# Patient Record
Sex: Female | Born: 1985
Health system: Southern US, Community
[De-identification: ages and names within clinical notes are randomized; demographics above are authoritative.]

## PROBLEM LIST (undated history)

## (undated) DIAGNOSIS — R569 Unspecified convulsions: Secondary | ICD-10-CM

## (undated) DIAGNOSIS — G8929 Other chronic pain: Secondary | ICD-10-CM

## (undated) DIAGNOSIS — F319 Bipolar disorder, unspecified: Secondary | ICD-10-CM

## (undated) DIAGNOSIS — I10 Essential (primary) hypertension: Secondary | ICD-10-CM

## (undated) DIAGNOSIS — R079 Chest pain, unspecified: Secondary | ICD-10-CM

## (undated) DIAGNOSIS — F445 Conversion disorder with seizures or convulsions: Secondary | ICD-10-CM

## (undated) DIAGNOSIS — D649 Anemia, unspecified: Secondary | ICD-10-CM

## (undated) HISTORY — DX: Essential (primary) hypertension: I10

## (undated) HISTORY — PX: PELVIC LAPAROSCOPY: SHX162

## (undated) HISTORY — DX: Anemia, unspecified: D64.9

## (undated) HISTORY — DX: Bipolar disorder, unspecified: F31.9

---
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
## 2005-01-14 ENCOUNTER — Ambulatory Visit: Payer: Self-pay | Admitting: Sports Medicine

## 2005-01-28 ENCOUNTER — Emergency Department (HOSPITAL_COMMUNITY): Admission: EM | Admit: 2005-01-28 | Discharge: 2005-01-28 | Payer: Self-pay | Admitting: Family Medicine

## 2005-02-13 ENCOUNTER — Emergency Department (HOSPITAL_COMMUNITY): Admission: EM | Admit: 2005-02-13 | Discharge: 2005-02-13 | Payer: Self-pay | Admitting: Family Medicine

## 2005-07-06 ENCOUNTER — Ambulatory Visit: Payer: Self-pay | Admitting: Family Medicine

## 2005-07-18 ENCOUNTER — Encounter (INDEPENDENT_AMBULATORY_CARE_PROVIDER_SITE_OTHER): Payer: Self-pay | Admitting: *Deleted

## 2005-07-18 LAB — CONVERTED CEMR LAB

## 2005-08-05 ENCOUNTER — Other Ambulatory Visit: Admission: RE | Admit: 2005-08-05 | Discharge: 2005-08-05 | Payer: Self-pay | Admitting: Family Medicine

## 2005-08-05 ENCOUNTER — Ambulatory Visit: Payer: Self-pay | Admitting: Family Medicine

## 2005-10-10 ENCOUNTER — Emergency Department (HOSPITAL_COMMUNITY): Admission: EM | Admit: 2005-10-10 | Discharge: 2005-10-11 | Payer: Self-pay | Admitting: Emergency Medicine

## 2005-12-09 ENCOUNTER — Ambulatory Visit: Payer: Self-pay | Admitting: Family Medicine

## 2005-12-19 ENCOUNTER — Ambulatory Visit: Payer: Self-pay | Admitting: Family Medicine

## 2005-12-29 ENCOUNTER — Inpatient Hospital Stay (HOSPITAL_COMMUNITY): Admission: RE | Admit: 2005-12-29 | Discharge: 2005-12-30 | Payer: Self-pay | Admitting: *Deleted

## 2005-12-29 ENCOUNTER — Ambulatory Visit: Payer: Self-pay | Admitting: Family Medicine

## 2005-12-30 ENCOUNTER — Ambulatory Visit: Payer: Self-pay | Admitting: *Deleted

## 2006-01-09 ENCOUNTER — Ambulatory Visit: Payer: Self-pay | Admitting: Family Medicine

## 2006-01-12 ENCOUNTER — Emergency Department (HOSPITAL_COMMUNITY): Admission: EM | Admit: 2006-01-12 | Discharge: 2006-01-12 | Payer: Self-pay | Admitting: Emergency Medicine

## 2006-01-12 ENCOUNTER — Ambulatory Visit: Payer: Self-pay | Admitting: Family Medicine

## 2006-03-14 ENCOUNTER — Emergency Department (HOSPITAL_COMMUNITY): Admission: EM | Admit: 2006-03-14 | Discharge: 2006-03-14 | Payer: Self-pay | Admitting: Family Medicine

## 2006-05-05 ENCOUNTER — Ambulatory Visit: Payer: Self-pay | Admitting: Family Medicine

## 2006-05-08 ENCOUNTER — Emergency Department (HOSPITAL_COMMUNITY): Admission: EM | Admit: 2006-05-08 | Discharge: 2006-05-08 | Payer: Self-pay | Admitting: Family Medicine

## 2006-06-07 ENCOUNTER — Ambulatory Visit: Payer: Self-pay | Admitting: Family Medicine

## 2006-08-06 ENCOUNTER — Emergency Department (HOSPITAL_COMMUNITY): Admission: EM | Admit: 2006-08-06 | Discharge: 2006-08-06 | Payer: Self-pay | Admitting: Family Medicine

## 2006-09-14 DIAGNOSIS — E669 Obesity, unspecified: Secondary | ICD-10-CM | POA: Insufficient documentation

## 2006-09-14 DIAGNOSIS — G4700 Insomnia, unspecified: Secondary | ICD-10-CM | POA: Insufficient documentation

## 2006-09-15 ENCOUNTER — Encounter (INDEPENDENT_AMBULATORY_CARE_PROVIDER_SITE_OTHER): Payer: Self-pay | Admitting: *Deleted

## 2006-10-23 ENCOUNTER — Telehealth: Payer: Self-pay | Admitting: *Deleted

## 2006-12-08 ENCOUNTER — Encounter (INDEPENDENT_AMBULATORY_CARE_PROVIDER_SITE_OTHER): Payer: Self-pay | Admitting: Family Medicine

## 2006-12-08 ENCOUNTER — Ambulatory Visit: Payer: Self-pay | Admitting: Family Medicine

## 2006-12-08 LAB — CONVERTED CEMR LAB
Chlamydia, DNA Probe: NEGATIVE
GC Probe Amp, Genital: NEGATIVE
Glucose, Urine, Semiquant: NEGATIVE
Nitrite: NEGATIVE
Specific Gravity, Urine: 1.02
Whiff Test: POSITIVE

## 2007-01-08 ENCOUNTER — Encounter (INDEPENDENT_AMBULATORY_CARE_PROVIDER_SITE_OTHER): Payer: Self-pay | Admitting: Family Medicine

## 2007-03-01 ENCOUNTER — Ambulatory Visit: Payer: Self-pay | Admitting: Gynecology

## 2007-03-19 ENCOUNTER — Emergency Department (HOSPITAL_COMMUNITY): Admission: EM | Admit: 2007-03-19 | Discharge: 2007-03-19 | Payer: Self-pay | Admitting: Emergency Medicine

## 2007-04-17 ENCOUNTER — Ambulatory Visit (HOSPITAL_COMMUNITY): Admission: RE | Admit: 2007-04-17 | Discharge: 2007-04-17 | Payer: Self-pay | Admitting: Gynecology

## 2007-04-17 ENCOUNTER — Ambulatory Visit: Payer: Self-pay | Admitting: Gynecology

## 2007-05-10 ENCOUNTER — Ambulatory Visit: Payer: Self-pay | Admitting: Gynecology

## 2007-07-03 ENCOUNTER — Telehealth (INDEPENDENT_AMBULATORY_CARE_PROVIDER_SITE_OTHER): Payer: Self-pay | Admitting: *Deleted

## 2007-08-19 ENCOUNTER — Emergency Department (HOSPITAL_COMMUNITY): Admission: EM | Admit: 2007-08-19 | Discharge: 2007-08-19 | Payer: Self-pay | Admitting: Emergency Medicine

## 2007-09-03 ENCOUNTER — Ambulatory Visit: Payer: Self-pay | Admitting: Family Medicine

## 2007-09-03 ENCOUNTER — Encounter: Payer: Self-pay | Admitting: *Deleted

## 2007-09-03 LAB — CONVERTED CEMR LAB
Blood in Urine, dipstick: NEGATIVE
Glucose, Urine, Semiquant: NEGATIVE
Protein, U semiquant: 30
Urobilinogen, UA: NEGATIVE

## 2007-09-04 ENCOUNTER — Encounter (INDEPENDENT_AMBULATORY_CARE_PROVIDER_SITE_OTHER): Payer: Self-pay | Admitting: *Deleted

## 2007-09-08 ENCOUNTER — Emergency Department (HOSPITAL_COMMUNITY): Admission: EM | Admit: 2007-09-08 | Discharge: 2007-09-08 | Payer: Self-pay | Admitting: Emergency Medicine

## 2007-09-10 ENCOUNTER — Encounter (INDEPENDENT_AMBULATORY_CARE_PROVIDER_SITE_OTHER): Payer: Self-pay | Admitting: *Deleted

## 2007-09-28 ENCOUNTER — Telehealth: Payer: Self-pay | Admitting: *Deleted

## 2007-10-01 ENCOUNTER — Encounter (INDEPENDENT_AMBULATORY_CARE_PROVIDER_SITE_OTHER): Payer: Self-pay | Admitting: Family Medicine

## 2007-10-01 ENCOUNTER — Ambulatory Visit: Payer: Self-pay | Admitting: Family Medicine

## 2007-10-01 LAB — CONVERTED CEMR LAB
Albumin: 4.1 g/dL (ref 3.5–5.2)
Beta hcg, urine, semiquantitative: NEGATIVE
CO2: 22 meq/L (ref 19–32)
Calcium: 9.4 mg/dL (ref 8.4–10.5)
Creatinine, Ser: 0.64 mg/dL (ref 0.40–1.20)
Glucose, Urine, Semiquant: NEGATIVE
HCT: 36.1 % (ref 36.0–46.0)
Hemoglobin: 11.7 g/dL — ABNORMAL LOW (ref 12.0–15.0)
MCV: 86.8 fL (ref 78.0–100.0)
Monocytes Absolute: 0.4 10*3/uL (ref 0.1–1.0)
Neutro Abs: 2.5 10*3/uL (ref 1.7–7.7)
Nitrite: NEGATIVE
Pap Smear: NORMAL
Platelets: 255 10*3/uL (ref 150–400)
RDW: 13.7 % (ref 11.5–15.5)
Sodium: 144 meq/L (ref 135–145)
Total Protein: 7.4 g/dL (ref 6.0–8.3)
WBC: 4.9 10*3/uL (ref 4.0–10.5)
Whiff Test: NEGATIVE

## 2007-10-02 ENCOUNTER — Encounter (INDEPENDENT_AMBULATORY_CARE_PROVIDER_SITE_OTHER): Payer: Self-pay | Admitting: Family Medicine

## 2007-10-16 ENCOUNTER — Ambulatory Visit: Payer: Self-pay | Admitting: Family Medicine

## 2007-10-16 ENCOUNTER — Telehealth: Payer: Self-pay | Admitting: *Deleted

## 2007-10-16 LAB — CONVERTED CEMR LAB
Glucose, Urine, Semiquant: NEGATIVE
Protein, U semiquant: NEGATIVE

## 2007-10-19 ENCOUNTER — Ambulatory Visit: Payer: Self-pay | Admitting: Family Medicine

## 2007-10-19 ENCOUNTER — Encounter (INDEPENDENT_AMBULATORY_CARE_PROVIDER_SITE_OTHER): Payer: Self-pay | Admitting: Family Medicine

## 2007-10-19 DIAGNOSIS — N809 Endometriosis, unspecified: Secondary | ICD-10-CM | POA: Insufficient documentation

## 2007-11-07 ENCOUNTER — Telehealth: Payer: Self-pay | Admitting: *Deleted

## 2008-01-21 ENCOUNTER — Telehealth: Payer: Self-pay | Admitting: *Deleted

## 2008-01-22 ENCOUNTER — Encounter (INDEPENDENT_AMBULATORY_CARE_PROVIDER_SITE_OTHER): Payer: Self-pay | Admitting: Family Medicine

## 2008-01-22 ENCOUNTER — Ambulatory Visit: Payer: Self-pay | Admitting: Family Medicine

## 2008-01-22 LAB — CONVERTED CEMR LAB
Beta hcg, urine, semiquantitative: NEGATIVE
Whiff Test: NEGATIVE

## 2008-01-23 LAB — CONVERTED CEMR LAB
Chlamydia, DNA Probe: NEGATIVE
GC Probe Amp, Genital: NEGATIVE

## 2008-02-27 ENCOUNTER — Emergency Department (HOSPITAL_COMMUNITY): Admission: EM | Admit: 2008-02-27 | Discharge: 2008-02-27 | Payer: Self-pay | Admitting: Emergency Medicine

## 2008-03-08 ENCOUNTER — Emergency Department (HOSPITAL_COMMUNITY): Admission: EM | Admit: 2008-03-08 | Discharge: 2008-03-09 | Payer: Self-pay | Admitting: Emergency Medicine

## 2008-06-20 ENCOUNTER — Telehealth: Payer: Self-pay | Admitting: *Deleted

## 2008-06-27 ENCOUNTER — Ambulatory Visit: Payer: Self-pay | Admitting: Family Medicine

## 2008-06-27 ENCOUNTER — Encounter: Payer: Self-pay | Admitting: Family Medicine

## 2008-06-27 LAB — CONVERTED CEMR LAB
Basophils Absolute: 0 10*3/uL (ref 0.0–0.1)
Bilirubin Urine: NEGATIVE
Glucose, Urine, Semiquant: NEGATIVE
Ketones, urine, test strip: NEGATIVE
Monocytes Absolute: 0.5 10*3/uL (ref 0.1–1.0)
Monocytes Relative: 12 % (ref 3–12)
Protein, U semiquant: NEGATIVE
RBC: 4.22 M/uL (ref 3.87–5.11)
Specific Gravity, Urine: 1.02
TSH: 1.298 microintl units/mL (ref 0.350–4.50)
pH: 7

## 2008-06-30 ENCOUNTER — Encounter: Payer: Self-pay | Admitting: Family Medicine

## 2008-07-18 ENCOUNTER — Emergency Department (HOSPITAL_COMMUNITY): Admission: EM | Admit: 2008-07-18 | Discharge: 2008-07-19 | Payer: Self-pay | Admitting: Emergency Medicine

## 2008-07-20 ENCOUNTER — Telehealth: Payer: Self-pay | Admitting: Family Medicine

## 2008-07-21 ENCOUNTER — Encounter: Payer: Self-pay | Admitting: Family Medicine

## 2008-07-21 ENCOUNTER — Ambulatory Visit: Payer: Self-pay | Admitting: Family Medicine

## 2008-07-21 LAB — CONVERTED CEMR LAB
AST: 16 units/L (ref 0–37)
Albumin: 4.1 g/dL (ref 3.5–5.2)
Basophils Absolute: 0 10*3/uL (ref 0.0–0.1)
Basophils Relative: 0 % (ref 0–1)
Benzodiazepines.: NEGATIVE
CO2: 23 meq/L (ref 19–32)
Calcium: 9.4 mg/dL (ref 8.4–10.5)
Eosinophils Absolute: 0.1 10*3/uL (ref 0.0–0.7)
Eosinophils Relative: 1 % (ref 0–5)
Glucose, Bld: 90 mg/dL (ref 70–99)
Hemoglobin: 12.1 g/dL (ref 12.0–15.0)
Marijuana Metabolite: NEGATIVE
Methadone: NEGATIVE
Monocytes Absolute: 0.5 10*3/uL (ref 0.1–1.0)
Monocytes Relative: 10 % (ref 3–12)
Neutrophils Relative %: 52 % (ref 43–77)
RBC: 4.4 M/uL (ref 3.87–5.11)
Sodium: 141 meq/L (ref 135–145)
WBC: 4.5 10*3/uL (ref 4.0–10.5)

## 2008-07-22 ENCOUNTER — Encounter: Admission: RE | Admit: 2008-07-22 | Discharge: 2008-07-22 | Payer: Self-pay | Admitting: Family Medicine

## 2008-07-23 ENCOUNTER — Telehealth (INDEPENDENT_AMBULATORY_CARE_PROVIDER_SITE_OTHER): Payer: Self-pay | Admitting: *Deleted

## 2008-07-23 ENCOUNTER — Telehealth: Payer: Self-pay | Admitting: *Deleted

## 2008-07-24 ENCOUNTER — Telehealth (INDEPENDENT_AMBULATORY_CARE_PROVIDER_SITE_OTHER): Payer: Self-pay | Admitting: Family Medicine

## 2008-07-24 ENCOUNTER — Encounter (INDEPENDENT_AMBULATORY_CARE_PROVIDER_SITE_OTHER): Payer: Self-pay | Admitting: Family Medicine

## 2008-07-30 ENCOUNTER — Encounter (INDEPENDENT_AMBULATORY_CARE_PROVIDER_SITE_OTHER): Payer: Self-pay | Admitting: Family Medicine

## 2008-07-30 ENCOUNTER — Ambulatory Visit: Payer: Self-pay | Admitting: Family Medicine

## 2008-08-03 ENCOUNTER — Telehealth (INDEPENDENT_AMBULATORY_CARE_PROVIDER_SITE_OTHER): Payer: Self-pay | Admitting: Family Medicine

## 2008-08-08 ENCOUNTER — Encounter (INDEPENDENT_AMBULATORY_CARE_PROVIDER_SITE_OTHER): Payer: Self-pay | Admitting: *Deleted

## 2008-08-14 ENCOUNTER — Ambulatory Visit (HOSPITAL_COMMUNITY): Admission: RE | Admit: 2008-08-14 | Discharge: 2008-08-14 | Payer: Self-pay | Admitting: Family Medicine

## 2008-08-20 ENCOUNTER — Encounter (INDEPENDENT_AMBULATORY_CARE_PROVIDER_SITE_OTHER): Payer: Self-pay | Admitting: Family Medicine

## 2008-08-21 ENCOUNTER — Encounter (INDEPENDENT_AMBULATORY_CARE_PROVIDER_SITE_OTHER): Payer: Self-pay | Admitting: Family Medicine

## 2008-08-22 ENCOUNTER — Encounter (INDEPENDENT_AMBULATORY_CARE_PROVIDER_SITE_OTHER): Payer: Self-pay | Admitting: Family Medicine

## 2008-08-29 ENCOUNTER — Telehealth (INDEPENDENT_AMBULATORY_CARE_PROVIDER_SITE_OTHER): Payer: Self-pay | Admitting: *Deleted

## 2008-09-19 ENCOUNTER — Emergency Department (HOSPITAL_COMMUNITY): Admission: EM | Admit: 2008-09-19 | Discharge: 2008-09-20 | Payer: Self-pay | Admitting: Emergency Medicine

## 2008-09-20 ENCOUNTER — Telehealth: Payer: Self-pay | Admitting: *Deleted

## 2008-09-23 ENCOUNTER — Ambulatory Visit: Payer: Self-pay | Admitting: Family Medicine

## 2008-09-23 ENCOUNTER — Encounter (INDEPENDENT_AMBULATORY_CARE_PROVIDER_SITE_OTHER): Payer: Self-pay | Admitting: Family Medicine

## 2008-09-23 DIAGNOSIS — F3189 Other bipolar disorder: Secondary | ICD-10-CM | POA: Insufficient documentation

## 2008-09-29 ENCOUNTER — Telehealth (INDEPENDENT_AMBULATORY_CARE_PROVIDER_SITE_OTHER): Payer: Self-pay | Admitting: Family Medicine

## 2008-09-29 ENCOUNTER — Encounter (INDEPENDENT_AMBULATORY_CARE_PROVIDER_SITE_OTHER): Payer: Self-pay | Admitting: Family Medicine

## 2008-09-30 ENCOUNTER — Telehealth (INDEPENDENT_AMBULATORY_CARE_PROVIDER_SITE_OTHER): Payer: Self-pay | Admitting: Family Medicine

## 2008-10-01 ENCOUNTER — Encounter (INDEPENDENT_AMBULATORY_CARE_PROVIDER_SITE_OTHER): Payer: Self-pay | Admitting: Family Medicine

## 2008-10-23 ENCOUNTER — Encounter: Payer: Self-pay | Admitting: *Deleted

## 2008-10-24 ENCOUNTER — Encounter (INDEPENDENT_AMBULATORY_CARE_PROVIDER_SITE_OTHER): Payer: Self-pay | Admitting: Family Medicine

## 2008-11-17 ENCOUNTER — Encounter (INDEPENDENT_AMBULATORY_CARE_PROVIDER_SITE_OTHER): Payer: Self-pay | Admitting: Family Medicine

## 2008-11-17 ENCOUNTER — Ambulatory Visit: Payer: Self-pay | Admitting: Family Medicine

## 2008-11-17 DIAGNOSIS — R1013 Epigastric pain: Secondary | ICD-10-CM

## 2008-11-17 DIAGNOSIS — K3189 Other diseases of stomach and duodenum: Secondary | ICD-10-CM | POA: Insufficient documentation

## 2008-12-10 ENCOUNTER — Ambulatory Visit: Payer: Self-pay | Admitting: Family Medicine

## 2008-12-10 ENCOUNTER — Encounter (INDEPENDENT_AMBULATORY_CARE_PROVIDER_SITE_OTHER): Payer: Self-pay | Admitting: Family Medicine

## 2008-12-21 ENCOUNTER — Telehealth: Payer: Self-pay | Admitting: Family Medicine

## 2009-01-26 ENCOUNTER — Encounter: Payer: Self-pay | Admitting: Family Medicine

## 2009-01-27 ENCOUNTER — Encounter: Payer: Self-pay | Admitting: Family Medicine

## 2009-02-26 ENCOUNTER — Encounter: Payer: Self-pay | Admitting: Family Medicine

## 2009-04-19 ENCOUNTER — Emergency Department (HOSPITAL_COMMUNITY): Admission: EM | Admit: 2009-04-19 | Discharge: 2009-04-19 | Payer: Self-pay | Admitting: Emergency Medicine

## 2009-04-19 ENCOUNTER — Emergency Department (HOSPITAL_COMMUNITY): Admission: EM | Admit: 2009-04-19 | Discharge: 2009-04-19 | Payer: Self-pay | Admitting: Family Medicine

## 2009-04-28 ENCOUNTER — Telehealth: Payer: Self-pay | Admitting: Sports Medicine

## 2009-06-21 ENCOUNTER — Telehealth: Payer: Self-pay | Admitting: Family Medicine

## 2009-06-26 ENCOUNTER — Encounter: Payer: Self-pay | Admitting: Family Medicine

## 2009-06-26 ENCOUNTER — Ambulatory Visit: Payer: Self-pay | Admitting: Family Medicine

## 2009-06-26 DIAGNOSIS — R1084 Generalized abdominal pain: Secondary | ICD-10-CM | POA: Insufficient documentation

## 2009-06-26 DIAGNOSIS — R079 Chest pain, unspecified: Secondary | ICD-10-CM | POA: Insufficient documentation

## 2009-06-26 LAB — CONVERTED CEMR LAB
Bilirubin Urine: NEGATIVE
GC Probe Amp, Genital: NEGATIVE
Nitrite: NEGATIVE
Specific Gravity, Urine: 1.025
Urobilinogen, UA: 0.2
Whiff Test: NEGATIVE

## 2009-06-27 ENCOUNTER — Encounter: Payer: Self-pay | Admitting: Family Medicine

## 2009-11-30 ENCOUNTER — Emergency Department (HOSPITAL_COMMUNITY): Admission: EM | Admit: 2009-11-30 | Discharge: 2009-11-30 | Payer: Self-pay | Admitting: Family Medicine

## 2009-12-01 ENCOUNTER — Emergency Department (HOSPITAL_COMMUNITY): Admission: EM | Admit: 2009-12-01 | Discharge: 2009-12-01 | Payer: Self-pay | Admitting: Emergency Medicine

## 2009-12-18 ENCOUNTER — Ambulatory Visit: Payer: Self-pay | Admitting: Family Medicine

## 2009-12-18 ENCOUNTER — Encounter: Payer: Self-pay | Admitting: Family Medicine

## 2009-12-18 DIAGNOSIS — N912 Amenorrhea, unspecified: Secondary | ICD-10-CM | POA: Insufficient documentation

## 2009-12-18 DIAGNOSIS — R300 Dysuria: Secondary | ICD-10-CM | POA: Insufficient documentation

## 2009-12-18 LAB — CONVERTED CEMR LAB
Beta hcg, urine, semiquantitative: NEGATIVE
Ketones, urine, test strip: NEGATIVE
Protein, U semiquant: 30
Specific Gravity, Urine: 1.025

## 2009-12-22 ENCOUNTER — Telehealth: Payer: Self-pay | Admitting: Family Medicine

## 2010-01-20 ENCOUNTER — Telehealth: Payer: Self-pay | Admitting: Family Medicine

## 2010-01-21 ENCOUNTER — Encounter: Payer: Self-pay | Admitting: Sports Medicine

## 2010-01-21 ENCOUNTER — Ambulatory Visit: Payer: Self-pay | Admitting: Family Medicine

## 2010-01-21 LAB — CONVERTED CEMR LAB: Beta hcg, urine, semiquantitative: NEGATIVE

## 2010-03-04 ENCOUNTER — Encounter: Payer: Self-pay | Admitting: Family Medicine

## 2010-03-04 ENCOUNTER — Ambulatory Visit: Payer: Self-pay | Admitting: Family Medicine

## 2010-03-05 ENCOUNTER — Telehealth: Payer: Self-pay | Admitting: Family Medicine

## 2010-03-05 LAB — CONVERTED CEMR LAB: hCG, Beta Chain, Quant, S: <2 milliintl units/mL

## 2010-03-18 ENCOUNTER — Ambulatory Visit: Payer: Self-pay | Admitting: Family Medicine

## 2010-03-18 LAB — CONVERTED CEMR LAB: Whiff Test: NEGATIVE

## 2010-03-29 ENCOUNTER — Telehealth: Payer: Self-pay | Admitting: Family Medicine

## 2010-04-28 ENCOUNTER — Other Ambulatory Visit: Admission: RE | Admit: 2010-04-28 | Discharge: 2010-04-28 | Payer: Self-pay | Admitting: Gynecology

## 2010-04-28 ENCOUNTER — Ambulatory Visit: Payer: Self-pay | Admitting: Gynecology

## 2010-05-27 ENCOUNTER — Ambulatory Visit: Payer: Self-pay | Admitting: Gynecology

## 2010-06-14 ENCOUNTER — Ambulatory Visit: Payer: Self-pay | Admitting: Gynecology

## 2010-07-18 ENCOUNTER — Emergency Department (HOSPITAL_COMMUNITY)
Admission: EM | Admit: 2010-07-18 | Discharge: 2010-07-18 | Payer: Self-pay | Source: Home / Self Care | Admitting: Family Medicine

## 2010-07-22 ENCOUNTER — Encounter: Payer: Self-pay | Admitting: Family Medicine

## 2010-07-23 ENCOUNTER — Encounter: Payer: Self-pay | Admitting: Family Medicine

## 2010-07-26 ENCOUNTER — Ambulatory Visit: Admit: 2010-07-26 | Payer: Self-pay | Admitting: Women's Health

## 2010-08-17 ENCOUNTER — Ambulatory Visit
Admission: RE | Admit: 2010-08-17 | Discharge: 2010-08-17 | Payer: Self-pay | Source: Home / Self Care | Attending: Gynecology | Admitting: Gynecology

## 2010-08-19 NOTE — Progress Notes (Signed)
Summary: triage   Phone Note Call from Patient Call back at Home Phone [phone] Call back at or [phone]   Caller: Patient Summary of Call: Having side effects from birth control and would like to ask questions. Initial call taken by: Clydell Hakim,  January 20, 2010 8:53 AM  Follow-up for Phone Call        LM Follow-up by: Golden Circle RN,  January 20, 2010 8:56 AM  Additional Follow-up for Phone Call Additional follow up Details #1::        c/o cramping & breasts tender. has been on OCP x 2 weeks. home preg test negative. wants to be seen & discuss concerns over depo wearing off & ocps working. states she was told she would have a period by now & has not. wants to dicuss with md. works & cannot get here before 4pm. placed with Dr. Karie Schwalbe at 4 tomorrow Additional Follow-up by: Golden Circle RN,  January 20, 2010 8:57 AM

## 2010-08-19 NOTE — Letter (Signed)
Summary: [facility] D/C Summary  [facility] D/C Summary   Imported By: De Nurse 08/02/2010 13:56:02  _____________________________________________________________________  External Attachment:    Type:   Image     Comment:   External Document

## 2010-08-19 NOTE — Assessment & Plan Note (Signed)
Summary: questions about OCP/[place]   Vital Signs:  Patient profile:   25 year old female Weight:      223 pounds Temp:     98.5 degrees F oral Pulse rate:   89 / minute Pulse rhythm:   regular BP sitting:   138 / 80  (left arm) Cuff size:   large  Vitals Entered By: Loralee Pacas CMA (January 21, 2010 4:42 PM)  Primary Care Provider:  Alvia Grove DO   History of Present Illness: Pt comes in with concerns about missing period for almost 2 months now.  Breast tenderness, mood changes.    Hx:  Last Depo shot  ~October 07, 2009.  Was due for another end of June but switched to OCPs.  Didn't have her period and hasn't actually had any period since January while on Depo.  On Tri Sprintec. Has phenergan and naproxen.  Concerned she may be pregnant.   Current Medications (verified): 1)  Depakote Er 500 Mg Xr24h-Tab (Divalproex Sodium) .Marland Kitchen.. 1 Tablet By Mouth Three Times A Day 2)  Celexa .... Dose Unknown. 3)  Ranitidine Hcl 150 Mg Tabs (Ranitidine Hcl) .Marland Kitchen.. 1 Tab By Mouth Two Times A Day For Gerd 4)  Macrobid 100 Mg Caps (Nitrofurantoin Monohyd Macro) .... Take One Two Times A Day For 5 Days 5)  Ortho Tri-Cyclen (28) 0.18/0.215/0.25 Mg-35 Mcg Tabs (Norgestim-Eth Estrad Triphasic) .... Take One Pill Daily  Allergies (verified): 1)  ! Lamictal (Lamotrigine) 2)  ! * Contrast Dye  Review of Systems       See HPI  Physical Exam  General:  Well-developed,well-nourished,in no acute distress; alert,appropriate and cooperative throughout examination Abdomen:  Bowel sounds positive,abdomen soft and non-tender without masses, organomegaly or hernias noted.   Impression & Recommendations:  Problem # 1:  CONTRACEPTIVE MANAGEMENT (ICD-V25.09) Assessment Unchanged Upreg neg, explained how cycles work and how irregular menstruation coming off of depo was normal as the medicine leaves her system gradually.  Advised to stay on sprintec unless she wants to get pregnant.  Orders: [facility]- Est  Level  3 (16109)  Problem # 2:  AMENORRHEA (ICD-626.0) Assessment: Unchanged See #1.  Her updated medication list for this problem includes:    Ortho Tri-cyclen (28) 0.18/0.215/0.25 Mg-35 Mcg Tabs (Norgestim-eth estrad triphasic) .Marland Kitchen... Take one pill daily  Orders: U Preg-[facility] (81025) [facility]- Est Level  3 (60454)  Complete Medication List: 1)  Depakote Er 500 Mg Xr24h-tab (Divalproex sodium) .Marland Kitchen.. 1 tablet by mouth three times a day 2)  Celexa  .... Dose unknown. 3)  Ranitidine Hcl 150 Mg Tabs (Ranitidine hcl) .Marland Kitchen.. 1 tab by mouth two times a day for gerd 4)  Macrobid 100 Mg Caps (Nitrofurantoin monohyd macro) .... Take one two times a day for 5 days 5)  Ortho Tri-cyclen (28) 0.18/0.215/0.25 Mg-35 Mcg Tabs (Norgestim-eth estrad triphasic) .... Take one pill daily  Patient Instructions: 1)  Great to meet you 2)  Pregnancy test was negative. 3)  You will have irregular periods coming off the depo that could last a few months.  Continue the birth control pills unless you want to get pregnant. 4)  Use naproxen for pain and phenergan if needed for nausea.  Keep WELL HYDRATED. 5)  -Dr. Karie Schwalbe.  Laboratory Results   Urine Tests  Date/Time Received: January 21, 2010 4:45 PM  Date/Time Reported: January 21, 2010 4:52 PM     Urine HCG: negative Comments: ...............test performed by......Marland KitchenBonnie A. [place], MLS (ASCP)cm

## 2010-08-19 NOTE — Miscellaneous (Signed)
Summary: Orders Update   Clinical Lists Changes  Orders: Added new Test order of B-HCG Quant-[facility] [phone]) - Signed      Pt presented with her daughter who was being seen, has not had a cycle this month, upreg at home neg, period 1 week late. Beta HCG drawn. Pt is to transfer care so that family has the same PCP. Milinda Antis MD  March 04, 2010 10:53 AM

## 2010-08-19 NOTE — Consult Note (Signed)
Summary: [facility] - ED notes  [facility] - ED notes   Imported By: De Nurse 08/02/2010 13:55:30  _____________________________________________________________________  External Attachment:    Type:   Image     Comment:   External Document

## 2010-08-19 NOTE — Progress Notes (Signed)
Summary: BHCG-Negative   Phone Note Outgoing Call   Call placed by: Milinda Antis MD,  March 05, 2010 11:08 AM Details for Reason: UPREG  Summary of Call: Kaitlyn [name] is negative. Change in ovulation may be secondary to stopping OCP abruptly. Pt given option to hold until period starts or go ahead and start now.

## 2010-08-19 NOTE — Progress Notes (Signed)
Summary: triage   Phone Note Call from Patient Call back at Home Phone [phone]   Caller: Patient Summary of Call: Needs to ask question about an issue she is having with her birth control. Initial call taken by: Clydell Hakim,  March 29, 2010 3:15 PM  Follow-up for Phone Call        forget to take her ocp fri & sat & monday. had doubled up sunday. now on inactive pills. told her to take the inactive pills & restart a new pack Sunday . warned her that she is NOT protected against pregnancy & should either abstain or use 2 other methods of contraception-spermacides & condoms. after she has been on active pills 7 days, should have protection again. urged her to take at same time every day or they may not be as effective. told her we have other methods if she wants to discuss a change Follow-up by: Golden Circle RN,  March 29, 2010 3:28 PM    agree

## 2010-08-19 NOTE — Progress Notes (Signed)
 Summary: Emergency Line Call   Phone Note Call from Patient   Caller: Patient Call For: Emergency Line Summary of Call: 65F calls in with complaints of back pain for 2 weeks.  Ambulatory, able to carry out ADLs, no fevers/chills, no dysuria, had UTI but finished course of antibiotics, pain does not radiate, no loss of bladder/stool continence.  Informed her I will not call in medications and that she needs to call the [facility] in the morning to make appt for SDA.  She agrees to do so. Initial call taken by: Debby Petties MD,  April 28, 2009 7:12 PM

## 2010-08-19 NOTE — Assessment & Plan Note (Signed)
Summary: BV?,df   Vital Signs:  Patient profile:   25 year old female Height:      60 inches Weight:      222 pounds BMI:     43.51 Temp:     98.8 degrees F oral Pulse rate:   102 / minute BP sitting:   137 / 83  (left arm) Cuff size:   large  Vitals Entered By: Tessie Fass CMA (March 18, 2010 4:20 PM)  Primary Care Provider:  Alvia Grove DO  CC:  VAGINAL ITCH.  History of Present Illness: 25 yo F:  1. Vaginal Itch: x several days, no real discharge or odor, + sexually active, used an OTC cream yesterday for yeast that has not helped. LMP > 6 weeks ago - had neg serum preg test 8/18, taking OCPs. + GC/Chl, and Trich in past.  Habits & Providers  Alcohol-Tobacco-Diet     Tobacco Status: never  Current Medications (verified): 1)  Depakote Er 500 Mg Xr24h-Tab (Divalproex Sodium) .Marland Kitchen.. 1 Tablet By Mouth Three Times A Day 2)  Celexa .... Dose Unknown. 3)  Ranitidine Hcl 150 Mg Tabs (Ranitidine Hcl) .Marland Kitchen.. 1 Tab By Mouth Two Times A Day For Gerd 4)  Ortho Tri-Cyclen (28) 0.18/0.215/0.25 Mg-35 Mcg Tabs (Norgestim-Eth Estrad Triphasic) .... Take One Pill Daily 5)  Diflucan 150 Mg Tabs (Fluconazole) .... Once Daily, Then Again in 3 Days  Allergies (verified): 1)  ! Lamictal (Lamotrigine) 2)  ! * Contrast Dye PMH-FH-SH reviewed for relevance  Review of Systems General:  Denies chills and fever. GI:  Denies abdominal pain. GU:  Complains of discharge; denies abnormal vaginal bleeding, dysuria, genital sores, hematuria, urinary frequency, and urinary hesitancy. Derm:  Complains of itching.  Physical Exam  General:  Well-developed, well-nourished, in no acute distress; alert, appropriate and cooperative throughout examination. Vitals reviewed. Genitalia:  Normal introitus, no external lesions, mucosa pink and moist, and no vaginal or cervical lesions.  Small amount of thick, white, cottage-cheese discharge - samples taken. Psych:  Oriented X3, memory intact for recent and  remote, normally interactive, good eye contact, not anxious appearing, and not depressed appearing.     Impression & Recommendations:  Problem # 1:  VAGINAL DISCHARGE (ICD-623.5) Assessment New  Orders: Wet Prep- [facility] (54098) [facility]- Est Level  3 (11914)  Problem # 2:  VAGINITIS, CANDIDAL (ICD-112.1) Assessment: New  Her updated medication list for this problem includes:    Diflucan 150 Mg Tabs (Fluconazole) ..... Once daily, then again in 3 days  Orders: [facility]- Est Level  3 (78295)  Complete Medication List: 1)  Depakote Er 500 Mg Xr24h-tab (Divalproex sodium) .Marland Kitchen.. 1 tablet by mouth three times a day 2)  Celexa  .... Dose unknown. 3)  Ranitidine Hcl 150 Mg Tabs (Ranitidine hcl) .Marland Kitchen.. 1 tab by mouth two times a day for gerd 4)  Ortho Tri-cyclen (28) 0.18/0.215/0.25 Mg-35 Mcg Tabs (Norgestim-eth estrad triphasic) .... Take one pill daily 5)  Diflucan 150 Mg Tabs (Fluconazole) .... Once daily, then again in 3 days Prescriptions: DIFLUCAN 150 MG TABS (FLUCONAZOLE) once daily, then again in 3 days  #2 x 0   Entered and Authorized by:   Helane Rima DO   Signed by:   Helane Rima DO on 03/18/2010   Method used:   Print then Give to Patient   RxID:   [id number]   Laboratory Results  Date/Time Received: March 18, 2010 4:51 PM  Date/Time Reported: March 18, 2010 4:54 PM  [employer] Source: vag WBC/hpf: rare Bacteria/hpf: 2+  Rods Clue cells/hpf: none  Negative whiff Yeast/hpf: many Trichomonas/hpf: none Comments: ...............test performed by......Marland KitchenBonnie A. [place], MLS (ASCP)cm    Prevention & Chronic Care Immunizations   Influenza vaccine: Not documented   Influenza vaccine deferral: Deferred  (03/18/2010)    Tetanus booster: 02/15/2002: Done.   Tetanus booster due: 02/16/2012    Pneumococcal vaccine: Not documented  Other Screening   Pap smear: normal  (10/01/2007)   Pap smear due: 09/30/2009   Smoking status: never  (03/18/2010)

## 2010-08-19 NOTE — Letter (Signed)
Summary: Handout Printed  Printed Handout:  - Contraceptives, Hormonal 

## 2010-08-19 NOTE — Assessment & Plan Note (Signed)
Summary: UTI?,df   Vital Signs:  Patient profile:   25 year old female Weight:      223.0 pounds BMI:     43.71 Temp:     98.6 degrees F Pulse rate:   87 / minute BP sitting:   123 / 75  (right arm)  Vitals Entered By: Starleen Blue RN (December 18, 2009 9:44 AM) CC: ? uti Is Patient Diabetic? No Pain Assessment Patient in pain? no        Primary Care Provider:  Alvia Grove DO  CC:  ? uti.  History of Present Illness: increased urgency and frequency however each void is very little volume.  no dysuria not cloudy.  no fevers, no back pain  family planning- currently in a monogamous relationship.  not using condoms but thinks that her partner has been checked for STDs.  would like to start OCPs and stop depo.  did well with ortho tricyclen in the past  Habits & Providers  Alcohol-Tobacco-Diet     Tobacco Status: never  Current Medications (verified): 1)  Depakote Er 500 Mg Xr24h-Tab (Divalproex Sodium) .Marland Kitchen.. 1 Tablet By Mouth Three Times A Day 2)  Celexa .... Dose Unknown. 3)  Ranitidine Hcl 150 Mg Tabs (Ranitidine Hcl) .Marland Kitchen.. 1 Tab By Mouth Two Times A Day For Gerd 4)  Macrobid 100 Mg Caps (Nitrofurantoin Monohyd Macro) .... Take One Two Times A Day For 5 Days 5)  Ortho Tri-Cyclen (28) 0.18/0.215/0.25 Mg-35 Mcg Tabs (Norgestim-Eth Estrad Triphasic) .... Take One Pill Daily  Allergies (verified): 1)  ! Lamictal (Lamotrigine) 2)  ! * Contrast Dye  Review of Systems  The patient denies fever, abdominal pain, hematuria, incontinence, and genital sores.    Physical Exam  General:  Well-developed,well-nourished,in no acute distress; alert,appropriate and cooperative throughout examination Abdomen:  Bowel sounds positive,abdomen soft and non-tender without masses, organomegaly or hernias noted. no CVA tenderness   Impression & Recommendations:  Problem # 1:  DYSURIA (ICD-788.1) Assessment New U/A with large LE, 3+ bacteria, loaded with WBC.  will treat with macrobid  and send for cx.    Her updated medication list for this problem includes:    Macrobid 100 Mg Caps (Nitrofurantoin monohyd macro) .Marland Kitchen... Take one two times a day for 5 days  Orders: Urinalysis-[facility] (00000) [facility]- Est Level  3 (44010) Urine Culture-[facility] ([id number])  Problem # 2:  CONTRACEPTIVE MANAGEMENT (ICD-V25.09) Assessment: Unchanged sent script for ortho tricyclen as she liked this in the past.  does not want to get pregnant now, u preg negative.  pt to start before the end of the month, when depo shot would be due.  non smoker. Orders: [facility]- Est Level  3 (34742)  Complete Medication List: 1)  Depakote Er 500 Mg Xr24h-tab (Divalproex sodium) .Marland Kitchen.. 1 tablet by mouth three times a day 2)  Celexa  .... Dose unknown. 3)  Ranitidine Hcl 150 Mg Tabs (Ranitidine hcl) .Marland Kitchen.. 1 tab by mouth two times a day for gerd 4)  Macrobid 100 Mg Caps (Nitrofurantoin monohyd macro) .... Take one two times a day for 5 days 5)  Ortho Tri-cyclen (28) 0.18/0.215/0.25 Mg-35 Mcg Tabs (Norgestim-eth estrad triphasic) .... Take one pill daily  Other Orders: U Preg-[facility] (59563)  Patient Instructions: 1)  I have sent your script for antibiotics to the pharmacy.  Also, I sent the ortho tricyclin.  I sent your urine for culture to make sure your antibiotic will cover the infection, if we need to change it, I will  call you.   2)  If you develop fevers or pain over your kidneys, call us back Prescriptions: ORTHO TRI-CYCLEN (28) 0.18/0.215/0.25 MG-35 MCG TABS (NORGESTIM-ETH ESTRAD TRIPHASIC) take one pill daily  #1 x 11   Entered and Authorized by:   Ellery Plunk MD   Signed by:   Ellery Plunk MD on 12/18/2009   Method used:   Electronically to        [facility] [phone]* (retail)       [address]       [place], [place]  [address]       Ph: [phone]       Fax: [phone]   RxID:   [id number] MACROBID 100 MG CAPS (NITROFURANTOIN MONOHYD MACRO) take one two times a day for 5 days  #10 x 0    Entered and Authorized by:   Ellery Plunk MD   Signed by:   Ellery Plunk MD on 12/18/2009   Method used:   Electronically to        [facility] [phone]* (retail)       [address]       [place], [place]  [address]       Ph: [phone]       Fax: [phone]   RxID:   [id number] ORTHO TRI-CYCLEN (28) 0.18/0.215/0.25 MG-35 MCG TABS (NORGESTIM-ETH ESTRAD TRIPHASIC) take one pill daily  #1 x 11   Entered and Authorized by:   Ellery Plunk MD   Signed by:   Ellery Plunk MD on 12/18/2009   Method used:   Electronically to        [facility]  [phone]* (retail)       [address]       [place], [place]  [address]       Ph: [phone] or [phone]       Fax: [phone]   RxID:   [id number] MACROBID 100 MG CAPS (NITROFURANTOIN MONOHYD MACRO) take one two times a day for 5 days  #10 x 0   Entered and Authorized by:   Ellery Plunk MD   Signed by:   Ellery Plunk MD on 12/18/2009   Method used:   Electronically to        [facility]  [phone]* (retail)       [address]       [place], [place]  [address]       Ph: [phone] or [phone]       Fax: [phone]   RxID:   [id number]   Laboratory Results   Urine Tests  Date/Time Received: December 18, 2009 9:39 AM  Date/Time Reported: December 18, 2009 10:06 AM December 18, 2009 10:43 AM   Routine Urinalysis   Color: orange Appearance: Cloudy Ketone: negative   (Normal Range: Negative) Spec. Gravity: 1.025   (Normal Range: 1.003-1.035) Blood: small   (Normal Range: Negative) pH: 7.0   (Normal Range: 5.0-8.0) Protein: 30   (Normal Range: Negative) Leukocyte Esterace: large   (Normal Range: Negative)  Urine Microscopic WBC/HPF: loaded RBC/HPF: several present - unable to count Bacteria/HPF: 3+ Epithelial/HPF: 10-20    Urine HCG: negative Comments: unable to read part of dipstick due to color of urine ...............test  performed by......Marland KitchenBonnie A. [place], MLS (ASCP)cm       Vital Signs:  Patient profile:   25 year old female Weight:  223.0 pounds BMI:     43.71 Temp:     98.6 degrees F Pulse rate:   87 / minute BP sitting:   123 / 75  (right arm)  Vitals Entered By: Starleen Blue RN (December 18, 2009 9:44 AM)

## 2010-08-19 NOTE — Progress Notes (Signed)
Summary: triage  Medications Added BACTRIM DS 800-160 MG TABS (SULFAMETHOXAZOLE-TRIMETHOPRIM) take 1 pill by mouth two times a day x 7 days DIFLUCAN 150 MG TABS (FLUCONAZOLE) take 1 pill by mouth today.  May repeat 1 pill by mouth in 7 days       Phone Note Call from Patient Call back at Home Phone [phone]   Caller: Patient Summary of Call: wants to talk to nurse about bleeding Initial call taken by: De Nurse,  December 22, 2009 4:02 PM  Follow-up for Phone Call        has noticed vaginal blood when she wipes after voiding. small amount. no pain. she is on depo. she has difficulty getting here. c/o vag itrritation & is going to try some monistat she has at home. wants to know what to do. actually missed yesterdays meds as she slept all day. advised condoms until antibiotics are done is the antibiotic is the cause of the bleeding( depo affected by it?) will you rx yeast rx since she finshes her last antibiotic tomorrow night? if unable. we will try again to find her an appt she can get to. Follow-up by: Golden Circle RN,  December 22, 2009 4:11 PM    New/Updated Medications: BACTRIM DS 800-160 MG TABS (SULFAMETHOXAZOLE-TRIMETHOPRIM) take 1 pill by mouth two times a day x 7 days DIFLUCAN 150 MG TABS (FLUCONAZOLE) take 1 pill by mouth today.  May repeat 1 pill by mouth in 7 days Prescriptions: DIFLUCAN 150 MG TABS (FLUCONAZOLE) take 1 pill by mouth today.  May repeat 1 pill by mouth in 7 days  #2 x 0   Entered and Authorized by:   Alvia Grove DO   Signed by:   Alvia Grove DO on 12/22/2009   Method used:   Electronically to        [facility]  [phone]* (retail)       [address]       [place], [place]  [address]       Ph: [phone] or [phone]       Fax: [phone]   RxID:   [id number] BACTRIM DS 800-160 MG TABS (SULFAMETHOXAZOLE-TRIMETHOPRIM) take 1 pill by mouth two times a day x 7 days  #14 x 0   Entered and  Authorized by:   Alvia Grove DO   Signed by:   Alvia Grove DO on 12/22/2009   Method used:   Electronically to        [facility]  [phone]* (retail)       [address]       [place], [place]  [address]       Ph: [phone] or [phone]       Fax: [phone]   RxID:   [id number]   12-22-09 1945 Returned call to pt.   Pt reports worsening urinary frequency, + dysuria, + puritis.  Denies fevers, no chills, no abd or back pain. Pt started Abx saturday.  Forgot to take it yesterday and only took 1 dose today. Reviewed last visit w/ Dr. Lavell Anchors and urine cx results.  Will d/c macrobid and start Bactrim.  Diflucan for yeast. Discussed importance of taking medicine exacly as prescribed for effective tx.  Pt advised to use condoms with intercourse as abx decreases BC effect.  Ok to start OCP as planned at last visit. To RTC if no better by friday  or if she develops fevers, chills, back or abd pain. Pt voiced understanding.  Alvia Grove

## 2010-08-27 ENCOUNTER — Encounter: Payer: Self-pay | Admitting: *Deleted

## 2010-09-27 LAB — POCT URINALYSIS DIPSTICK
Ketones, ur: NEGATIVE mg/dL
Urobilinogen, UA: 0.2 mg/dL (ref 0.0–1.0)

## 2010-09-27 LAB — URINE CULTURE
Colony Count: 50000
Culture  Setup Time: 201201012120

## 2010-09-27 LAB — POCT PREGNANCY, URINE: Preg Test, Ur: NEGATIVE

## 2010-10-04 ENCOUNTER — Ambulatory Visit (INDEPENDENT_AMBULATORY_CARE_PROVIDER_SITE_OTHER): Payer: Medicaid Other | Admitting: Gynecology

## 2010-10-04 DIAGNOSIS — N912 Amenorrhea, unspecified: Secondary | ICD-10-CM

## 2010-10-04 LAB — POCT I-STAT, CHEM 8
BUN: 11 mg/dL (ref 6–23)
Calcium, Ion: 1.16 mmol/L (ref 1.12–1.32)
Chloride: 109 mEq/L (ref 96–112)
HCT: 37 % (ref 36.0–46.0)
Potassium: 3.6 mEq/L (ref 3.5–5.1)
Sodium: 141 mEq/L (ref 135–145)

## 2010-10-04 LAB — POCT URINALYSIS DIP (DEVICE)
Bilirubin Urine: NEGATIVE
Glucose, UA: NEGATIVE mg/dL
Ketones, ur: NEGATIVE mg/dL
pH: 6.5 (ref 5.0–8.0)

## 2010-10-04 LAB — WET PREP, GENITAL: Trich, Wet Prep: NONE SEEN

## 2010-10-04 LAB — GC/CHLAMYDIA PROBE AMP, GENITAL: Chlamydia, DNA Probe: NEGATIVE

## 2010-10-11 ENCOUNTER — Ambulatory Visit (INDEPENDENT_AMBULATORY_CARE_PROVIDER_SITE_OTHER): Payer: Medicaid Other | Admitting: Gynecology

## 2010-10-11 DIAGNOSIS — R350 Frequency of micturition: Secondary | ICD-10-CM

## 2010-10-11 DIAGNOSIS — N949 Unspecified condition associated with female genital organs and menstrual cycle: Secondary | ICD-10-CM

## 2010-10-21 LAB — GLUCOSE, CAPILLARY: Glucose-Capillary: 88 mg/dL (ref 70–99)

## 2010-10-21 LAB — URINALYSIS, ROUTINE W REFLEX MICROSCOPIC
Glucose, UA: NEGATIVE mg/dL
Hgb urine dipstick: NEGATIVE
Protein, ur: NEGATIVE mg/dL
Specific Gravity, Urine: 1.026 (ref 1.005–1.030)
Urobilinogen, UA: 1 mg/dL (ref 0.0–1.0)

## 2010-10-21 LAB — DIFFERENTIAL
Basophils Relative: 0 % (ref 0–1)
Eosinophils Absolute: 0.1 10*3/uL (ref 0.0–0.7)
Lymphs Abs: 1.8 10*3/uL (ref 0.7–4.0)
Monocytes Absolute: 0.4 10*3/uL (ref 0.1–1.0)
Monocytes Relative: 8 % (ref 3–12)

## 2010-10-21 LAB — CBC
Hemoglobin: 10.8 g/dL — ABNORMAL LOW (ref 12.0–15.0)
MCHC: 33 g/dL (ref 30.0–36.0)
MCV: 88.6 fL (ref 78.0–100.0)
RBC: 3.69 MIL/uL — ABNORMAL LOW (ref 3.87–5.11)
WBC: 5.8 10*3/uL (ref 4.0–10.5)

## 2010-10-21 LAB — BASIC METABOLIC PANEL
CO2: 24 mEq/L (ref 19–32)
Chloride: 111 mEq/L (ref 96–112)
GFR calc Af Amer: >60 mL/min (ref >60)
Potassium: 3.3 mEq/L — ABNORMAL LOW (ref 3.5–5.1)
Sodium: 139 mEq/L (ref 135–145)

## 2010-10-21 LAB — URINE MICROSCOPIC-ADD ON

## 2010-10-25 ENCOUNTER — Ambulatory Visit (INDEPENDENT_AMBULATORY_CARE_PROVIDER_SITE_OTHER): Payer: Medicaid Other | Admitting: Gynecology

## 2010-10-25 ENCOUNTER — Ambulatory Visit: Payer: Medicaid Other | Admitting: Gynecology

## 2010-10-25 DIAGNOSIS — R635 Abnormal weight gain: Secondary | ICD-10-CM

## 2010-10-25 DIAGNOSIS — N912 Amenorrhea, unspecified: Secondary | ICD-10-CM

## 2010-10-25 DIAGNOSIS — N970 Female infertility associated with anovulation: Secondary | ICD-10-CM

## 2010-10-28 LAB — POCT I-STAT, CHEM 8
BUN: 10 mg/dL (ref 6–23)
Calcium, Ion: 1.03 mmol/L — ABNORMAL LOW (ref 1.12–1.32)
Creatinine, Ser: 0.9 mg/dL (ref 0.4–1.2)
Glucose, Bld: 100 mg/dL — ABNORMAL HIGH (ref 70–99)
Hemoglobin: 12.2 g/dL (ref 12.0–15.0)
TCO2: 18 mmol/L (ref 0–100)

## 2010-10-28 LAB — CBC
Hemoglobin: 12 g/dL (ref 12.0–15.0)
MCHC: 34.1 g/dL (ref 30.0–36.0)
MCV: 88.6 fL (ref 78.0–100.0)
RBC: 3.97 MIL/uL (ref 3.87–5.11)
RDW: 14.6 % (ref 11.5–15.5)

## 2010-10-28 LAB — DIFFERENTIAL
Basophils Absolute: 0 10*3/uL (ref 0.0–0.1)
Basophils Relative: 1 % (ref 0–1)
Eosinophils Absolute: 0.1 10*3/uL (ref 0.0–0.7)
Eosinophils Relative: 1 % (ref 0–5)
Monocytes Absolute: 0.7 10*3/uL (ref 0.1–1.0)
Monocytes Relative: 10 % (ref 3–12)

## 2010-11-01 LAB — POCT I-STAT, CHEM 8
Calcium, Ion: 1.15 mmol/L (ref 1.12–1.32)
Creatinine, Ser: 0.8 mg/dL (ref 0.4–1.2)
Glucose, Bld: 89 mg/dL (ref 70–99)
HCT: 35 % — ABNORMAL LOW (ref 36.0–46.0)
Hemoglobin: 11.9 g/dL — ABNORMAL LOW (ref 12.0–15.0)
Potassium: 3.5 mEq/L (ref 3.5–5.1)
TCO2: 22 mmol/L (ref 0–100)

## 2010-11-15 ENCOUNTER — Inpatient Hospital Stay (INDEPENDENT_AMBULATORY_CARE_PROVIDER_SITE_OTHER)
Admission: RE | Admit: 2010-11-15 | Discharge: 2010-11-15 | Disposition: A | Discharge status: Home / Self Care | Payer: 59 | Source: Ambulatory Visit | Attending: Family Medicine | Admitting: Family Medicine

## 2010-11-15 DIAGNOSIS — N390 Urinary tract infection, site not specified: Secondary | ICD-10-CM

## 2010-11-15 LAB — POCT URINALYSIS DIP (DEVICE)
Ketones, ur: NEGATIVE mg/dL
Protein, ur: 30 mg/dL — AB
Urobilinogen, UA: 2 mg/dL — ABNORMAL HIGH (ref 0.0–1.0)
pH: 6.5 (ref 5.0–8.0)

## 2010-11-19 ENCOUNTER — Other Ambulatory Visit: Payer: 59

## 2010-11-19 ENCOUNTER — Ambulatory Visit (INDEPENDENT_AMBULATORY_CARE_PROVIDER_SITE_OTHER): Payer: 59 | Admitting: Gynecology

## 2010-11-19 DIAGNOSIS — N912 Amenorrhea, unspecified: Secondary | ICD-10-CM

## 2010-11-19 DIAGNOSIS — R635 Abnormal weight gain: Secondary | ICD-10-CM

## 2010-11-19 DIAGNOSIS — N83209 Unspecified ovarian cyst, unspecified side: Secondary | ICD-10-CM

## 2010-11-29 ENCOUNTER — Other Ambulatory Visit: Payer: Self-pay | Admitting: Gynecology

## 2010-11-29 DIAGNOSIS — N979 Female infertility, unspecified: Secondary | ICD-10-CM

## 2010-11-30 NOTE — Op Note (Signed)
NAMECECE, MILHOUSE NO.:  [id number]   MEDICAL RECORD NO.:  [id number]          PATIENT TYPE:  AMB   LOCATION:  SDC                           FACILITY:  [facility]   PHYSICIAN:  Ginger Carne, MD  DATE OF BIRTH:  1986/06/04   DATE OF PROCEDURE:  04/17/2007  DATE OF DISCHARGE:                               OPERATIVE REPORT   PREOPERATIVE DIAGNOSIS:  Chronic pelvic pain.   POSTOPERATIVE DIAGNOSIS:  Stage I endometriosis.   PROCEDURE:  Diagnostic laparoscopy.   SURGEON:  Ginger Carne, MD   ASSISTANT:  None.   COMPLICATIONS:  None immediate.   ESTIMATED BLOOD LOSS:  Minimal.   ANESTHESIA:  General.   OPERATIVE FINDINGS:  External genitalia, vulva and vagina normal.  Cervix smooth without erosions or lesions.  Laparoscopic evaluation  revealed a severely retroverted and retroflexed uterus with no evidence  of femoral, inguinal or obturator hernias.  A left corpus luteal cyst  was noted and otherwise both ovaries appeared normal without adhesive  disease of the adnexa.  There was areas of endometriosis red and  punctate on the left and right broad ligament around the uterosacral  ligaments.  Upper abdomen was normal.  Large and small bowel grossly  normal. Uterus was smooth with normal contour and not enlarged.   OPERATIVE PROCEDURE:  The patient prepped and draped in usual fashion  and placed in lithotomy position.  Betadine solution used for antiseptic  and the patient was catheterized prior to the procedure.  Afterwards a  tenaculum placed on the anterior lip of cervix.  A vertical  infraumbilical incision was made.  The Veress needle placed in the  abdomen.  Opening closing pressures were 10-15 mmHg.  Needle released,  trocar placed in same incision.  Laparoscope placed in trocar sleeve.  A  5 mm port was made under direct visualization in the left lower  quadrant.  Inspection of the pelvic contents followed by photography was  taken.  Afterwards  gas released, trocars removed.  Closure of the 10-mm  fascia site with 0 Vicryl suture and 4-0 Vicryl for subcuticular  closure.  Instrument and sponge count were correct.  The patient  tolerated the procedure well and returned post anesthesia recovery room  in excellent condition.     Ginger Carne, MD  Electronically Signed    SHB/MEDQ  D:  04/17/2007  T:  04/17/2007  Job:  [id number]

## 2010-11-30 NOTE — Group Therapy Note (Signed)
[name], Kaitlyn [name] NO.:  [id number]   MEDICAL RECORD NO.:  [id number]          PATIENT TYPE:  WOC   LOCATION:  [facility]                   FACILITY:  [facility]   PHYSICIAN:  Ginger Carne, MD DATE OF BIRTH:  Dec 16, 1985   DATE OF SERVICE:  03/01/2007                                  CLINIC NOTE   This patient is a 25 year old gravida 1, para 1-0-0-1 African American  female who presents with a 71-month history of lower abdominal/pelvic  pain.  The patient states that over time the pain has worsened but  varies throughout the month as far as the intensity.  She has been on  oral contraceptives for the past 3 years and then recently changed to  Depo Provera since July 2008.  She denies dysmenorrhea or dyspareunia.  She did not have dysmenorrhea on oral contraception.  The patient denies  genitourinary or gastrointestinal sources for her pain.   Her medical history is noncontributory as well surgical history.  She  has had one child in the past by vaginal delivery.   SALIENT PHYSICAL FINDINGS:  VITAL SIGNS:  Per record.  ABDOMEN:  Soft without gross hepatosplenomegaly.  PELVIC:  External genitalia, vulva and vagina are normal.  Cervix smooth  without erosions or lesions.  Uterus is globular about 6-8 weeks in  size, possible subserosal fibroids.  Both adnexa palpable, found to be  normal.  There was slight tenderness on uterine motion.   IMPRESSION:  Chronic pelvic pain.   PLAN:  A pelvic sonogram has been ordered to evaluate the patient for  possible leiomyoma.  She had a normal Pap smear at [facility] in  January of this year.  Also gonorrhea and chlamydia culture were  obtained at this visit.  The patient is scheduled for a diagnostic  laparoscopy to determine if she has chronic pelvic inflammatory disease  and/or endometriosis.  I do not think that at this point it is  worthwhile to sit tight regarding her pain as this has continued over  time and  worsen.  The nature of said procedure discussed in detail.           ______________________________  Ginger Carne, MD     SHB/MEDQ  D:  03/01/2007  T:  03/02/2007  Job:  [id number]

## 2010-11-30 NOTE — Procedures (Signed)
EEG NUMBER:  [id number].   HISTORY:  This is a 25 year old patient with bipolar disorder and  episodes of body shaking.  The patient does not have loss of  consciousness with the events.  This is a routine EEG.  No skull defects  were noted.   Medications include Depakote and birth control pills.   This is a routine EEG evaluation.   EEG CLASSIFICATION:  Normal awake.   DISCUSSION:  According to the background rhythm, this recording consists  of a fairly well-modulated medium amplitude alpha rhythm of 10 Hz that  is reactive to eye opening and closure.  As the record progresses, the  patient appears to be in the waking state.  Photic stimulation was  performed resulting in bilateral and symmetric photic driving response.  During 2 events of photic stimulation, the patient has generalized  jerking during photic stimulation, but no evidence of an electrographic  seizure is noted during these periods.  EEG is completely normal before  and after, and only muscle artifact is seen during the events.  Hyperventilation is also performed resulting in a minimal buildup of  back rhythm activity without significant slowing seen.  No time during  recording does there appear to be evidence of spike or spike wave  discharges or evidence of focal slowing.  The EKG monitor shows no  evidence of cardiac rhythm abnormalities with a heart rate of 72.   IMPRESSION:  This is a normal EEG recording in the waking state.  Two  events of generalized jerking are noted during the recording, but do not  correlate with electrographic seizure.      Marlan Palau, M.D.  Electronically Signed     ZOX:WRUE  D:  08/14/2008 11:57:39  T:  08/15/2008 01:22:30  Job #:  [id number]

## 2010-12-03 ENCOUNTER — Ambulatory Visit (HOSPITAL_COMMUNITY)
Admission: RE | Admit: 2010-12-03 | Discharge: 2010-12-03 | Disposition: A | Discharge status: Home / Self Care | Payer: 59 | Source: Ambulatory Visit | Attending: Gynecology | Admitting: Gynecology

## 2010-12-03 DIAGNOSIS — N979 Female infertility, unspecified: Secondary | ICD-10-CM
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
## 2010-12-03 NOTE — Op Note (Signed)
Kaitlyn [name], Kaitlyn [name]            ACCOUNT NO.:  [id number]   MEDICAL RECORD NO.:  [id number]          PATIENT TYPE:  WOC   LOCATION:  WOC                          FACILITY:  [facility]   PHYSICIAN:  Phil D. Okey Dupre, M.D.     DATE OF BIRTH:  04-Jul-1986   DATE OF PROCEDURE:  11/29/2004  DATE OF DISCHARGE:                                 OPERATIVE REPORT   PROCEDURE:  Low transverse cesarean section.   PREOPERATIVE DIAGNOSIS:  Nonreassuring fetal heart pattern, meconium fluid  and preeclampsia.   POSTOPERATIVE DIAGNOSIS:  Nonreassuring fetal heart pattern, meconium fluid  and preeclampsia.   SURGEON:  Javier Glazier. Okey Dupre, M.D.   FIRST ASSISTANT:  Eldridge Dace, M.D.   ESTIMATED BLOOD LOSS:  500 mL.   ANESTHESIA:  Epidural.   POSTOPERATIVE CONDITION:  Satisfactory. The infant female, 6 pounds 9 ounces  with a cord pH of 7.20.   DESCRIPTION OF PROCEDURE:  Under satisfactory epidural anesthesia with the  patient in dorsal supine position, a Foley catheter in urinary bladder, the  abdomen was prepped and draped in usual sterile manner. Entered through a  transverse incision situated 2 cm above the abdominal apron crease and  extending to a total length of 18 cm. Abdomen was entered by layers. On  entering the peritoneal cavity visceral peritoneum and the anterior surface  of the uterus was opened transversely by sharp dissection. The bladder was  pushed away from the lower uterine segment which was then entered by sharp  and blunt dissection and from an OP presentation, the baby was easily  delivered. On delivering the head before the baby took a breath, we used a  DeLee suction to suction the airways. Cord was doubly clamped, divided the  baby handed to pediatrician and samples of blood were taken from the cord  for analysis. There was heavy watery meconium fluid. The placenta was also  meconium stained had some adherence to the endometrium which was peeled off  quite easily and the uterine  cavity explored. The uterus was then closed  with a continuous running 0 Vicryl on an atraumatic needle in a locking  fashion and two figure-of-eight sutures were placed in the area of the  suture line where there was still a bit of oozing using the same suture  material. The area observed for bleeding and none was noted. The peritoneal  cavity was irrigated with normal saline and the fascia was closed with  continuous running 0 Vicryl on an atraumatic needle. Subcutaneous bleeders  were controlled with hot cautery. Subcutaneous approximation was carried out  with 0 Vicryl and skin edges approximated with skin staples. A dry sterile  dressing was applied. The patient tolerated the procedure well.  Approximately 500 mL blood loss and was transferred to recovery room in  satisfactory condition.    PDR/MEDQ  D:  11/29/2004  T:  11/29/2004  Job:  [id number]

## 2010-12-03 NOTE — Discharge Summary (Signed)
NAMEMACIAH, Kaitlyn [name]            ACCOUNT NO.:  [id number]   MEDICAL RECORD NO.:  [id number]          PATIENT TYPE:  INP   LOCATION:  9122                          [facility]ACILITY:  [facility]   PHYSICIAN:  Phil D. Okey Dupre, M.D.     DATE OF BIRTH:  04/20/1986   DATE OF ADMISSION:  11/28/2004  DATE OF DISCHARGE:  12/02/2004                                 DISCHARGE SUMMARY   Patient is an 25 year old primigravida 61 and 1 weeks' gestation who was  admitted in early labor with a cervix of 30% effacement, 1-2 cm dilated, and  -2 station with pregnancy induced hypertension and a blood pressure of  165/77.  Her PIH panel was normal on admission and IV magnesium sulfate was  given as it was thought that she was becoming preeclamptic.  By Nov 29, 2004  10:15 a.m. it was obvious that there was little progress going on.  There  was thick meconium and a nonreassuring fetal heart pattern with late  decelerations and poor urinary output.  It was decided to do a cesarean  section which was undertaken __________ as soon as possible.  It was a female  infant weighing 6 pounds 9 ounces and a cord pH of 7.2.  The patient did  well postoperatively, although her blood pressure did stay elevated and  would vary between 125 and 160 systolic over 57-89 diastolic.  The magnesium  sulfate was continued for 24 hours postoperative.  The patient was seen by  social services while she was in the hospital and by the morning of the 18th  we thought that we could probably discharge the patient.  She was afebrile.  Blood pressures were around 140-151/76-92 and she was discharged to return  for [facility] evaluation in three days.  Given Percocet for pain.  Her staples  were removed.  She was placed on iron, Ortho-Tri Cyclen to be started in two  weeks, and Colace.  She was discharged to home in satisfactory condition.       PDR/MEDQ  D:  01/03/2005  T:  01/03/2005  Job:  [id number]

## 2010-12-15 ENCOUNTER — Encounter: Payer: Self-pay | Admitting: *Deleted

## 2010-12-17 ENCOUNTER — Inpatient Hospital Stay (INDEPENDENT_AMBULATORY_CARE_PROVIDER_SITE_OTHER)
Admission: RE | Admit: 2010-12-17 | Discharge: 2010-12-17 | Disposition: A | Discharge status: Home / Self Care | Payer: 59 | Source: Ambulatory Visit | Attending: Emergency Medicine | Admitting: Emergency Medicine

## 2010-12-17 DIAGNOSIS — M79609 Pain in unspecified limb: Secondary | ICD-10-CM

## 2010-12-17 DIAGNOSIS — N390 Urinary tract infection, site not specified: Secondary | ICD-10-CM

## 2010-12-17 LAB — POCT URINALYSIS DIP (DEVICE)
Protein, ur: >=300 mg/dL — AB
Specific Gravity, Urine: 1.025 (ref 1.005–1.030)
Urobilinogen, UA: 2 mg/dL — ABNORMAL HIGH (ref 0.0–1.0)
pH: 5 (ref 5.0–8.0)

## 2010-12-20 LAB — URINE CULTURE
Colony Count: >=100000
Culture  Setup Time: 201206020141

## 2010-12-28 ENCOUNTER — Ambulatory Visit (INDEPENDENT_AMBULATORY_CARE_PROVIDER_SITE_OTHER): Payer: 59 | Admitting: Family Medicine

## 2010-12-28 VITALS — BP 123/86 | HR 82 | Temp 97.9°F | Wt 215.4 lb

## 2010-12-28 DIAGNOSIS — N912 Amenorrhea, unspecified: Secondary | ICD-10-CM

## 2010-12-28 DIAGNOSIS — N3289 Other specified disorders of bladder: Secondary | ICD-10-CM

## 2010-12-28 DIAGNOSIS — R300 Dysuria: Secondary | ICD-10-CM

## 2010-12-28 LAB — POCT URINALYSIS DIPSTICK
Bilirubin, UA: NEGATIVE
Glucose, UA: NEGATIVE
Ketones, UA: NEGATIVE
Leukocytes, UA: NEGATIVE
Nitrite, UA: POSITIVE
Protein, UA: NEGATIVE
Spec Grav, UA: 1.02
Urobilinogen, UA: 2
pH, UA: 7.5

## 2010-12-28 LAB — POCT UA - MICROSCOPIC ONLY

## 2010-12-28 MED ORDER — OXYBUTYNIN CHLORIDE ER 10 MG PO TB24: 10.0000 mg | ORAL_TABLET | Freq: Every day | ORAL | Status: DC
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
## 2010-12-28 NOTE — Progress Notes (Signed)
Addended by: [place], Shaunta Oncale on: 12/28/2010 12:22 PM   Modules accepted: Orders

## 2010-12-31 ENCOUNTER — Ambulatory Visit (INDEPENDENT_AMBULATORY_CARE_PROVIDER_SITE_OTHER): Payer: 59 | Admitting: Gynecology

## 2010-12-31 DIAGNOSIS — N970 Female infertility associated with anovulation: Secondary | ICD-10-CM

## 2010-12-31 DIAGNOSIS — N801 Endometriosis of ovary: Secondary | ICD-10-CM

## 2010-12-31 DIAGNOSIS — R635 Abnormal weight gain: Secondary | ICD-10-CM

## 2011-01-06 ENCOUNTER — Inpatient Hospital Stay (INDEPENDENT_AMBULATORY_CARE_PROVIDER_SITE_OTHER)
Admission: RE | Admit: 2011-01-06 | Discharge: 2011-01-06 | Disposition: A | Discharge status: Home / Self Care | Payer: 59 | Source: Ambulatory Visit | Attending: Family Medicine | Admitting: Family Medicine

## 2011-01-06 DIAGNOSIS — T23179A Burn of first degree of unspecified wrist, initial encounter: Secondary | ICD-10-CM

## 2011-02-04 ENCOUNTER — Ambulatory Visit (INDEPENDENT_AMBULATORY_CARE_PROVIDER_SITE_OTHER): Payer: 59 | Admitting: Gynecology

## 2011-02-04 DIAGNOSIS — N912 Amenorrhea, unspecified: Secondary | ICD-10-CM

## 2011-02-07 ENCOUNTER — Telehealth: Payer: Self-pay | Admitting: *Deleted

## 2011-02-07 NOTE — Telephone Encounter (Signed)
PT CALLED THE OFFICE TO LET ME KNOW THAT SHE WAS STILL BLEEDING. FROM NOTE ON 02/03/11. SPOKE WITH PT AND TOLD HER TO FOLLOW DIRECTIONS FORM JF NOTE.

## 2011-04-07 ENCOUNTER — Inpatient Hospital Stay (INDEPENDENT_AMBULATORY_CARE_PROVIDER_SITE_OTHER)
Admission: RE | Admit: 2011-04-07 | Discharge: 2011-04-07 | Disposition: A | Discharge status: Home / Self Care | Payer: 59 | Source: Ambulatory Visit | Attending: Family Medicine | Admitting: Family Medicine

## 2011-04-07 ENCOUNTER — Ambulatory Visit (INDEPENDENT_AMBULATORY_CARE_PROVIDER_SITE_OTHER): Payer: 59

## 2011-04-07 DIAGNOSIS — IMO0002 Reserved for concepts with insufficient information to code with codable children: Secondary | ICD-10-CM

## 2011-04-08 LAB — POCT URINALYSIS DIP (DEVICE)
Hgb urine dipstick: NEGATIVE
Nitrite: NEGATIVE
Protein, ur: 30 — AB
Specific Gravity, Urine: 1.02
Urobilinogen, UA: 0.2

## 2011-04-08 LAB — POCT PREGNANCY, URINE: Preg Test, Ur: NEGATIVE

## 2011-04-08 LAB — WET PREP, GENITAL: Clue Cells Wet Prep HPF POC: NONE SEEN

## 2011-04-18 ENCOUNTER — Telehealth: Payer: Self-pay | Admitting: *Deleted

## 2011-04-18 DIAGNOSIS — N926 Irregular menstruation, unspecified: Secondary | ICD-10-CM

## 2011-04-18 NOTE — Telephone Encounter (Signed)
Pt called complaining of cramping and bleeding starting today. Pt LMP:04/02/11 she is not on any birth control. She has not taking a pregnancy test yet, but she states there is a chance she could be pregnant. She has not yet started her clomid medication. Pt states that her period have been normal for 5 months, but her hard copy chart states it has not. Please advise.

## 2011-04-18 NOTE — Telephone Encounter (Signed)
Pt called appointment desk, wanting to ask question. Lm for pt to call.

## 2011-04-18 NOTE — Telephone Encounter (Signed)
Pt informed with the below note and order in computer.

## 2011-04-18 NOTE — Telephone Encounter (Signed)
Review of patient's record indicated that her last office visit in July 19 she has a history of oligomenorrhea and was attempting to get pregnant. Although she stated her last menstrual period was September 15 that she could possibly pregnant I would recommend that she can come by the office to get a serum pregnancy test and office visits for further evaluation. She has not started her clomiphene citrate according to the telephone call she made to one of my associates.

## 2011-04-26 ENCOUNTER — Encounter: Payer: Self-pay | Admitting: Gynecology

## 2011-04-26 ENCOUNTER — Ambulatory Visit (INDEPENDENT_AMBULATORY_CARE_PROVIDER_SITE_OTHER): Payer: 59 | Admitting: Gynecology

## 2011-04-26 VITALS — BP 128/82

## 2011-04-26 DIAGNOSIS — N912 Amenorrhea, unspecified: Secondary | ICD-10-CM

## 2011-04-26 NOTE — Progress Notes (Signed)
Patient is a 25 year old gravida 1 para 1 who presented to the office today with concerns about the possibility of being pregnant. Patient is not using any form of contraception. Her last menstrual period was reported to be September 15 and reported to be normal. She has mild breast tenderness mild nausea but no vomiting.  Exam: Abdomen: Soft nontender no rebound or guarding Pelvic: Bartholin urethra Skene was within normal limits Vagina: No lesions or discharge Cervix no lesions or discharge Uterus: Anteverted normal size shape and consistency Adnexa: No palpable masses or tenderness Rectum: Not done  Urine pregnancy test was negative as was the qualitative hCG.   patient was reassured and was offered contraception not interested she was instructed to make sure that she took a prenatal vitamin daily. She is due for her annual exam sometime before the end of the year.

## 2011-04-28 LAB — CBC
HCT: 37
Hemoglobin: 12.6
MCV: 86.4
Platelets: 250
RBC: 4.28
WBC: 4

## 2011-04-28 LAB — BASIC METABOLIC PANEL
Chloride: 107
GFR calc Af Amer: >60
GFR calc non Af Amer: >60
Potassium: 3.8
Sodium: 136

## 2011-04-29 LAB — DIFFERENTIAL
Basophils Relative: 1
Eosinophils Relative: 1
Monocytes Absolute: 0.4
Monocytes Relative: 8
Neutro Abs: 2.9

## 2011-04-29 LAB — URINALYSIS, ROUTINE W REFLEX MICROSCOPIC
Glucose, UA: NEGATIVE
Ketones, ur: NEGATIVE
Protein, ur: 30 — AB

## 2011-04-29 LAB — HEPATIC FUNCTION PANEL
ALT: 17
AST: 26
Bilirubin, Direct: 0.1
Indirect Bilirubin: 0.3
Total Bilirubin: 0.4

## 2011-04-29 LAB — I-STAT 8, (EC8 V) (CONVERTED LAB)
Acid-base deficit: 2
Bicarbonate: 23.8
HCT: 41
Hemoglobin: 13.9
Operator id: 270111
Sodium: 141
TCO2: 25

## 2011-04-29 LAB — CBC
HCT: 36.1
Hemoglobin: 12.1
MCHC: 33.5
RBC: 4.12

## 2011-04-29 LAB — URINE CULTURE

## 2011-04-29 LAB — URINE MICROSCOPIC-ADD ON

## 2011-05-18 ENCOUNTER — Ambulatory Visit (INDEPENDENT_AMBULATORY_CARE_PROVIDER_SITE_OTHER): Payer: 59 | Admitting: Gynecology

## 2011-05-18 ENCOUNTER — Encounter: Payer: Self-pay | Admitting: Gynecology

## 2011-05-18 VITALS — BP 126/78 | Ht 59.0 in | Wt 220.0 lb

## 2011-05-18 DIAGNOSIS — Z833 Family history of diabetes mellitus: Secondary | ICD-10-CM

## 2011-05-18 DIAGNOSIS — N949 Unspecified condition associated with female genital organs and menstrual cycle: Secondary | ICD-10-CM

## 2011-05-18 DIAGNOSIS — R823 Hemoglobinuria: Secondary | ICD-10-CM

## 2011-05-18 DIAGNOSIS — N809 Endometriosis, unspecified: Secondary | ICD-10-CM | POA: Insufficient documentation

## 2011-05-18 DIAGNOSIS — N938 Other specified abnormal uterine and vaginal bleeding: Secondary | ICD-10-CM

## 2011-05-18 DIAGNOSIS — Z01419 Encounter for gynecological examination (general) (routine) without abnormal findings: Secondary | ICD-10-CM | POA: Insufficient documentation

## 2011-05-18 DIAGNOSIS — N979 Female infertility, unspecified: Secondary | ICD-10-CM | POA: Insufficient documentation

## 2011-05-18 DIAGNOSIS — R635 Abnormal weight gain: Secondary | ICD-10-CM

## 2011-05-18 LAB — CBC WITH DIFFERENTIAL/PLATELET
Basophils Absolute: 0 10*3/uL (ref 0.0–0.1)
Basophils Relative: 0 % (ref 0–1)
Eosinophils Absolute: 0.1 10*3/uL (ref 0.0–0.7)
Eosinophils Relative: 1 % (ref 0–5)
HCT: 33.3 % — ABNORMAL LOW (ref 36.0–46.0)
Lymphocytes Relative: 41 % (ref 12–46)
MCH: 29 pg (ref 26.0–34.0)
MCHC: 32.4 g/dL (ref 30.0–36.0)
MCV: 89.3 fL (ref 78.0–100.0)
Monocytes Absolute: 0.5 10*3/uL (ref 0.1–1.0)
Platelets: 244 10*3/uL (ref 150–400)
RDW: 13.5 % (ref 11.5–15.5)
WBC: 4.3 10*3/uL (ref 4.0–10.5)

## 2011-05-18 NOTE — Progress Notes (Signed)
Addended by: Cammie Mcgee T on: 05/18/2011 02:48 PM   Modules accepted: Orders

## 2011-05-18 NOTE — Progress Notes (Signed)
KAYTEE TALIERCIO 1986/01/11 [id number]   History:    25 y.o.  for annual exam with complaint that this past month her cycle was somewhat irregular. She states that she has been having normal menstrual cycles until this month. She has been trying to get pregnant. Had several urine pregnancy test at home which were negative. This last menstrual period was 2 days start on the first and second of October and then restart again on the 25th and she's on the tail end  of it today. She is overweight she was weighing 214 pounds up to 220 now. Last year she was evaluated because of her history of oligomenorrhea and secondary infertility. She had a normal TSH, prolactin, and HSG. Patient with past history of endometriosis proven by laparoscopy and has had history of positive GC and Chlamydia cultures and treated as well. Her first and only pregnancy was with a prior partner. Current partner has had children in the past with different individual.  Past medical history,surgical history, family history and social history were all reviewed and documented in the EPIC chart.  ROS:  Was performed and pertinent positives and negatives are included in the history.  Exam: chaperone present BP 126/78  Ht 4\' 11"  (1.499 m)  Wt 220 lb (99.791 kg)  BMI 44.43 kg/m2  LMP 05/14/2011  Body mass index is 44.43 kg/(m^2).  General appearance : Well developed well nourished female. No acute distress HEENT: Neck supple, trachea midline, no carotid bruits, no thyroidmegaly Lungs: Clear to auscultation, no rhonchi or wheezes, or rib retractions  Heart: Regular rate and rhythm, no murmurs or gallops Breast:Examined in sitting and supine position were symmetrical in appearance, no palpable masses or tenderness,  no skin retraction, no nipple inversion, no nipple discharge, no skin discoloration, no axillary or supraclavicular lymphadenopathy Abdomen: no palpable masses or tenderness, no rebound or guarding Extremities: no edema or  skin discoloration or tenderness  Pelvic:  Bartholin, Urethra, Skene Glands: Within normal limits             Vagina: No gross lesions or discharge blood in vaginal vault  Cervix: No gross lesions or discharge  Uterus  anteverted, normal size, shape and consistency, non-tender and mobile  Adnexa  Without masses or tenderness  Anus and perineum  normal   Rectovaginal  normal sphincter tone without palpated masses or tenderness             Hemoccult not done      Assessment/Plan:  25 y.o. female for annual exam (gravida 1 para 1) normal today. Single isolated event of irregular cycle. She will continue her prenatal vitamins. She was instructed to engage in a regular exercise program to lose weight and Optimal conditions before the planned pregnancy. She will have her partner a drop off in a semen sample after 3 days of abstinence and then she will make an appointment to see me the week after to discuss further management. CBC, cholesterol, TSH, random blood sugar, urinalysis and Pap smear was done today. She was instructed to continue her prenatal vitamins. Last year she was given a trial of clomiphene citrate did not follow through. Patient states she wants to try on her own. We discussed the utilization of ovulation predictor kit with his common cycle and we'll see her after the semen analysis.    Ok Edwards MD, 11:00 AM 05/18/2011

## 2011-07-05 ENCOUNTER — Other Ambulatory Visit (HOSPITAL_COMMUNITY)
Admission: RE | Admit: 2011-07-05 | Discharge: 2011-07-05 | Disposition: A | Discharge status: Home / Self Care | Payer: 59 | Source: Ambulatory Visit | Attending: Gynecology | Admitting: Gynecology

## 2011-08-03 ENCOUNTER — Encounter (HOSPITAL_COMMUNITY): Payer: Self-pay | Admitting: *Deleted

## 2011-08-03 ENCOUNTER — Emergency Department (HOSPITAL_COMMUNITY)
Admission: EM | Admit: 2011-08-03 | Discharge: 2011-08-03 | Disposition: A | Discharge status: Home / Self Care | Payer: 59 | Source: Home / Self Care | Attending: Family Medicine | Admitting: Family Medicine

## 2011-08-03 DIAGNOSIS — J069 Acute upper respiratory infection, unspecified: Secondary | ICD-10-CM

## 2011-08-03 MED ORDER — IPRATROPIUM BROMIDE 0.06 % NA SOLN: 2.0000 | Freq: Four times a day (QID) | NASAL | Status: DC

## 2011-08-03 MED ORDER — GUAIFENESIN-CODEINE 100-10 MG/5ML PO SYRP: 5.0000 mL | ORAL_SOLUTION | Freq: Three times a day (TID) | ORAL | Status: AC | PRN

## 2011-08-03 NOTE — ED Provider Notes (Cosign Needed)
History     CSN: [id number]  Arrival date & time 08/03/11  1304   First MD Initiated Contact with Patient 08/03/11 1404      Chief Complaint  Patient presents with  . URI    (Consider location/radiation/quality/duration/timing/severity/associated sxs/prior treatment) Patient is a 26 y.o. female presenting with URI. The history is provided by the patient.  URI The primary symptoms include sore throat. The current episode started yesterday. This is a new problem. The problem has not changed since onset. The onset of the illness is associated with exposure to sick contacts. Symptoms associated with the illness include congestion and rhinorrhea.    Past Medical History  Diagnosis Date  . Bipolar disorder   . Anemia   . Endometriosis     ENDOMETRIOSIS???    Past Surgical History  Procedure Date  . Cesarean section   . Pelvic laparoscopy     Family History  Problem Relation Age of Onset  . Diabetes Mother   . Hypertension Mother   . Heart disease Mother     History  Substance Use Topics  . Smoking status: Never Smoker   . Smokeless tobacco: Never Used  . Alcohol Use: No    OB History    Grav Para Term Preterm Abortions TAB SAB Ect Mult Living   1 1 1       1       Review of Systems  Constitutional: Negative.   HENT: Positive for congestion, sore throat, rhinorrhea and postnasal drip.   Respiratory: Negative.   Gastrointestinal: Negative.   Genitourinary: Negative.   Skin: Negative.     Allergies  Iohexol and Lamotrigine  Home Medications   Current Outpatient Rx  Name Route Sig Dispense Refill  . CITALOPRAM HYDROBROMIDE 10 MG PO TABS  Dose unknown     . DIVALPROEX SODIUM ER 500 MG PO TB24 Oral Take 500 mg by mouth 3 (three) times daily.      Marland Kitchen FLUCONAZOLE 150 MG PO TABS Oral Take 150 mg by mouth daily. then again in 3 days     . NORGESTIM-ETH ESTRAD TRIPHASIC 0.18/0.215/0.25 MG-35 MCG PO TABS Oral Take by mouth daily.      . OXYBUTYNIN CHLORIDE ER 10  MG PO TB24 Oral Take 1 tablet (10 mg total) by mouth daily. 30 tablet 2  . PRENATAL PLUS 27-1 MG PO TABS Oral Take 1 tablet by mouth daily.      Marland Kitchen RANITIDINE HCL 150 MG PO TABS Oral Take 150 mg by mouth 2 (two) times daily. for GERD     . GUAIFENESIN-CODEINE 100-10 MG/5ML PO SYRP Oral Take 5 mLs by mouth 3 (three) times daily as needed for cough. 120 mL 0  . IPRATROPIUM BROMIDE 0.06 % NA SOLN Nasal Place 2 sprays into the nose 4 (four) times daily. 15 mL 1    BP 107/71  Pulse 86  Temp(Src) 98.7 F (37.1 C) (Oral)  Resp 16  SpO2 100%  LMP 07/16/2011  Physical Exam  Nursing note and vitals reviewed. Constitutional: [name], place, and time. She appears well-developed and well-nourished.  HENT:  Head: Normocephalic.  Left Ear: External ear normal.  Mouth/Throat: Oropharynx is clear and moist.  Eyes: Conjunctivae are normal. Pupils are equal, round, and reactive to light.  Neck: Normal range of motion. Neck supple.  Cardiovascular: Normal rate, normal heart sounds and intact distal pulses.   Pulmonary/Chest: Effort normal and breath sounds normal.  Abdominal: Soft. Bowel sounds are normal.  Lymphadenopathy:    She has no cervical adenopathy.  Neurological: She is alert and oriented to person, place, and time.  Skin: Skin is warm and dry.    ED Course  Procedures (including critical care time)  Labs Reviewed - No data to display No results found.   1. Upper respiratory infection       MDM          Barkley Bruns, MD 08/03/11 1420

## 2011-08-03 NOTE — ED Notes (Signed)
Pt c/o fever, bodyaches, chills, sorethroat, headache and congestion onset yesterday.  States fever at home was 101.

## 2011-08-04 ENCOUNTER — Telehealth (HOSPITAL_COMMUNITY): Payer: Self-pay | Admitting: *Deleted

## 2011-08-11 ENCOUNTER — Emergency Department (INDEPENDENT_AMBULATORY_CARE_PROVIDER_SITE_OTHER)
Admission: EM | Admit: 2011-08-11 | Discharge: 2011-08-11 | Disposition: A | Discharge status: Home / Self Care | Payer: 59 | Source: Home / Self Care | Attending: Emergency Medicine | Admitting: Emergency Medicine

## 2011-08-11 ENCOUNTER — Encounter (HOSPITAL_COMMUNITY): Payer: Self-pay | Admitting: *Deleted

## 2011-08-11 DIAGNOSIS — T148 Other injury of unspecified body region: Secondary | ICD-10-CM

## 2011-08-11 DIAGNOSIS — W57XXXA Bitten or stung by nonvenomous insect and other nonvenomous arthropods, initial encounter: Secondary | ICD-10-CM

## 2011-08-11 DIAGNOSIS — T148XXA Other injury of unspecified body region, initial encounter: Secondary | ICD-10-CM

## 2011-08-11 MED ORDER — FLUTICASONE PROPIONATE 0.05 % EX CREA: TOPICAL_CREAM | Freq: Two times a day (BID) | CUTANEOUS | Status: DC

## 2011-08-11 NOTE — ED Notes (Signed)
C/o insect bite middle finger left hand and left shoulder

## 2011-08-11 NOTE — ED Provider Notes (Signed)
History     CSN: [id number]  Arrival date & time 08/11/11  1712   First MD Initiated Contact with Patient 08/11/11 1718      Chief Complaint  Patient presents with  . Insect Bite    (Consider location/radiation/quality/duration/timing/severity/associated sxs/prior treatment) HPI Comments: Patient reports having insect bites on left hand and wrist. Occurred last pm. States her daughter has similar bites. She thinks they are bedbugs. She got rid of her mattress . Has used a little cortisone cream.  The history is provided by the patient.    Past Medical History  Diagnosis Date  . Bipolar disorder   . Anemia   . Endometriosis     ENDOMETRIOSIS???    Past Surgical History  Procedure Date  . Cesarean section   . Pelvic laparoscopy     Family History  Problem Relation Age of Onset  . Diabetes Mother   . Hypertension Mother   . Heart disease Mother     History  Substance Use Topics  . Smoking status: Never Smoker   . Smokeless tobacco: Never Used  . Alcohol Use: No    OB History    Grav Para Term Preterm Abortions TAB SAB Ect Mult Living   1 1 1       1       Review of Systems  Constitutional: Negative.   HENT: Negative.   Respiratory: Negative.   Cardiovascular: Negative.   Gastrointestinal: Negative.   Genitourinary: Negative.     Allergies  Iohexol and Lamotrigine  Home Medications   Current Outpatient Rx  Name Route Sig Dispense Refill  . CITALOPRAM HYDROBROMIDE 10 MG PO TABS  Dose unknown     . DIVALPROEX SODIUM ER 500 MG PO TB24 Oral Take 500 mg by mouth 3 (three) times daily.      Marland Kitchen FLUCONAZOLE 150 MG PO TABS Oral Take 150 mg by mouth daily. then again in 3 days     . FLUTICASONE PROPIONATE 0.05 % EX CREA Topical Apply topically 2 (two) times daily. 30 g 0  . GUAIFENESIN-CODEINE 100-10 MG/5ML PO SYRP Oral Take 5 mLs by mouth 3 (three) times daily as needed for cough. 120 mL 0  . IPRATROPIUM BROMIDE 0.06 % NA SOLN Nasal Place 2 sprays into the  nose 4 (four) times daily. 15 mL 1  . NORGESTIM-ETH ESTRAD TRIPHASIC 0.18/0.215/0.25 MG-35 MCG PO TABS Oral Take by mouth daily.      . OXYBUTYNIN CHLORIDE ER 10 MG PO TB24 Oral Take 1 tablet (10 mg total) by mouth daily. 30 tablet 2  . PRENATAL PLUS 27-1 MG PO TABS Oral Take 1 tablet by mouth daily.      Marland Kitchen RANITIDINE HCL 150 MG PO TABS Oral Take 150 mg by mouth 2 (two) times daily. for GERD       BP 124/88  Pulse 92  Temp(Src) 98.6 F (37 C) (Oral)  Resp 16  SpO2 100%  LMP 07/16/2011  Physical Exam  Constitutional: She appears well-developed and well-nourished. No distress.  Cardiovascular: Normal rate and regular rhythm.   Pulmonary/Chest: Effort normal and breath sounds normal.  Skin: Skin is warm and dry.       Has three red raised areas in a line on dorsum of left wrist with redness and swelling in a hive like pattern. itches    ED Course  Procedures (including critical care time)  Labs Reviewed - No data to display No results found.   1. Insect bites  MDM          Randa Spike, MD 08/11/11 [phone]

## 2011-08-24 ENCOUNTER — Encounter: Payer: Self-pay | Admitting: Gynecology

## 2011-08-24 ENCOUNTER — Ambulatory Visit (INDEPENDENT_AMBULATORY_CARE_PROVIDER_SITE_OTHER): Payer: 59 | Admitting: Gynecology

## 2011-08-24 VITALS — BP 110/80

## 2011-08-24 DIAGNOSIS — R1030 Lower abdominal pain, unspecified: Secondary | ICD-10-CM

## 2011-08-24 DIAGNOSIS — N644 Mastodynia: Secondary | ICD-10-CM

## 2011-08-24 DIAGNOSIS — R1011 Right upper quadrant pain: Secondary | ICD-10-CM

## 2011-08-24 DIAGNOSIS — R109 Unspecified abdominal pain: Secondary | ICD-10-CM

## 2011-08-24 LAB — URINALYSIS W MICROSCOPIC + REFLEX CULTURE
Bilirubin Urine: NEGATIVE
Hgb urine dipstick: NEGATIVE
Ketones, ur: NEGATIVE mg/dL
Nitrite: NEGATIVE
Urobilinogen, UA: 0.2 mg/dL (ref 0.0–1.0)

## 2011-08-24 NOTE — Patient Instructions (Addendum)
Cholelithiasis Cholelithiasis (also called gallstones) is a form of gallbladder disease where gallstones form in your gallbladder. The gallbladder is a non-essential organ that stores bile made in the liver, which helps digest fats. Gallstones begin as small crystals and slowly grow into stones. Gallstone pain occurs when the gallbladder spasms, and a gallstone is blocking the duct. Pain can also occur when a stone passes out of the duct.  Women are more likely to develop gallstones than men. Other factors that increase the risk of gallbladder disease are:  Having multiple pregnancies. Physicians sometimes advise removing diseased gallbladders before future pregnancies.   Obesity.   Diets heavy in fried foods and fat.   Increasing age (older than 49).   Prolonged use of medications containing female hormones.   Diabetes mellitus.   Rapid weight loss.   Family history of gallstones (heredity).  SYMPTOMS  Feeling sick to your stomach (nauseous).   Abdominal pain.   Yellowing of the skin (jaundice).   Sudden pain. It may persist from several minutes to several hours.   Worsening pain with deep breathing or when jarred.   Fever.   Tenderness to the touch.  In some cases, when gallstones do not move into the bile duct, people have no pain or symptoms. These are called "silent" gallstones. TREATMENT In severe cases, emergency surgery may be required. HOME CARE INSTRUCTIONS   Only take over-the-counter or prescription medicines for pain, discomfort, or fever as directed by your caregiver.   Follow a low-fat diet until seen again. Fat causes the gallbladder to contract, which can result in pain.   Follow up as instructed. Attacks are almost always recurrent and surgery is usually required for permanent treatment.  SEEK IMMEDIATE MEDICAL CARE IF:   Your pain increases and is not controlled by medications.   You have an oral temperature above 102 F (38.9 C), not controlled by  medication.   You develop nausea and vomiting.  MAKE SURE YOU:   Understand these instructions.   Will watch your condition.   Will get help right away if you are not doing well or get worse.  Document Released: 06/30/2005 Document Revised: 03/16/2011 Document Reviewed: 09/02/2010 [facility] Patient Information 2012 [place], [place].  Breast Tenderness Breast tenderness is a common complaint made by women of all ages. It is also called mastalgia or mastodynia, which means breast pain. The condition can range from mild discomfort to severe pain. It has a variety of causes. Your caregiver will find out the likely cause of your breast tenderness by examining your breasts, asking you about symptoms and perhaps ordering some tests. Breast tenderness usually does not mean you have breast cancer. CAUSES  Breast tenderness has many possible causes. They include:  Premenstrual changes. A week to 10 days before your period, your breasts might ache or feel tender.   Other hormonal causes. These include:   When sexual and physical traits mature (puberty).   Pregnancy.   The time right before and the year after menopause (perimenopause).   The day when it has been 12 months since your last period (menopause).   Large breasts.   Infection (also called mastitis).   Birth control pills.   Breastfeeding. Tenderness can occur if the breasts are overfull with milk or if a milk duct is blocked.   Injury.   Fibrocystic breast changes. This is not cancer (benign). It causes painful breasts that feel lumpy.   Fluid-filled sacs (cysts). Often cysts can be drained in your healthcare provider's  office.   Fibroadenoma. This is a tumor that is not cancerous.   Medication side effects. Blood pressure drugs and diuretics (which increase urine flow) sometimes cause breast tenderness.   Previous breast surgery, such as a breast reduction.   Breast cancer. Cancer is rarely the reason breasts are  tender. In most women, tenderness is caused by something else.  DIAGNOSIS  Several methods can be used to find out why your breasts are tender. They include:  Visual inspection of the breasts.   Examination by hand.   Tests, such as:   Mammogram.   Ultrasound.   Biopsy.   Lab test of any fluid coming from the nipple.   Blood tests.   MRI.  TREATMENT  Treatment is directed to the cause of the breast tenderness from doing nothing for minor discomfort, wearing a good support bra but also may include:  Taking over-the-counter medicines for pain or discomfort as directed by your caregiver.   Prescription medicine for breast tenderness related to:   Premenstrual.   Fibrocystic.   Puberty.   Pregnancy.   Menopause.   Previous breast surgery.   Large breasts.   Antibiotics for infection.   Birth control pills for fibrocystic and premenstrual changes.   More frequent feedings or pumping of the breasts and warm compresses for breast engorgement when nursing.   Cold and warm compresses and a good support bra for most breast injuries.   Breast cysts are sometimes drained with a needle (aspiration) or removed with minor surgery.   Fibroadenomas are usually removed with minor surgery.   Changing or stopping the medicine when it is responsible for causing the breast tenderness.   When breast cancer is present with or without causing pain, it is usually treated with major surgery (with or without radiation) and chemotherapy.  HOME CARE INSTRUCTIONS  Breast tenderness often can be handled at home. You can try:  Getting fitted for a new bra that provides more support, especially during exercise.   Wearing a more supportive or sports bra while sleeping when your breasts are very tender.   If you have a breast injury, using an ice pack for 15 to 20 minutes. Wrap the pack in a towel. Do not put the ice pack directly on your breast.   If your breasts are too full of milk  as a result of breastfeeding, try:   Expressing milk either by hand or with a breast pump.   Applying a warm compress for relief.   Taking over-the-counter pain relievers, if this is OK with your caregiver.   Taking medicine that your caregiver prescribes. These might include antibiotics or birth control pills.  Over the long term, your breast tenderness might be eased if you:  Cut down on caffeine.   Reduce the amount of fat in your diet.  Also, learn how to do breast examinations at home. This will help you tell when you have an unusual growth or lump that could cause tenderness. And keep a log of the days and times when your breasts are most tender. This will help you and your caregiver find the right solution. SEEK MEDICAL CARE IF:   Any part of your breast is hard, red and hot to the touch. This could be a sign of infection.   Fluid is coming out of your nipples (and you are not breastfeeding). Especially watch for blood or pus.   You have a fever as well as breast tenderness.   You have a  new or painful lump in your breast that remains after your period ends.   You have tried to take care of the pain at home, but it has not gone away.   Your breast pain is getting worse. Or, the pain is making it hard to do the things you usually do during your day.  Document Released: 06/16/2008 Document Revised: 03/16/2011 Document Reviewed: 06/16/2008 [facility] Patient Information 2012 [place], [place].

## 2011-08-24 NOTE — Progress Notes (Signed)
Patient is a 26 year old gravida 1 para 1 who presented to the office today with complaints. #1 breast tenderness which started a few days ago. She's currently on day 16 of her cycle. She's not using any form of contraception. Her main focus of today's visit is her midepigastric pain that she states is worsened by meals. She states she's having normal bowel movements no urinary problems no fever chills nausea or vomiting. Sometimes she feels the discomfort radiating to her lower abdomen. She denies any dyspareunia or any postcoital bleeding or any hematochezia.  Exam: Breast exam: Both breasts were examined there were symmetrical in appearance no skin discoloration or nipple inversion no palpable masses or tenderness no supraclavicular axillary lymphadenopathy.  Abdominal exam: Abdomen was soft positive Murphy sign on the right upper quadrant on deep inspiration. No rebound or guarding.  Pelvic exam: Bartholin urethra Skene glands within normal limits Vagina: No lesions or discharge Cervix no lesions or discharge Uterus: Anteverted normal size shape and consistency Adnexa: No palpable masses or tenderness Rectal: Not examined  Assessment for plan: Symptomatology suspicious for cholelithiasis. Cannot rule out H. pylori. We'll do the following lab tests: Comprehensive metabolic panel, H. pylori antibody IgG, urinalysis and we'll order for an abdominal ultrasound. She was instructed to refrain from fried foods. Literature information was provided.

## 2011-08-25 ENCOUNTER — Telehealth: Payer: Self-pay | Admitting: *Deleted

## 2011-08-25 LAB — COMPREHENSIVE METABOLIC PANEL
AST: 18 U/L (ref 0–37)
Albumin: 3.9 g/dL (ref 3.5–5.2)
Alkaline Phosphatase: 76 U/L (ref 39–117)
BUN: 12 mg/dL (ref 6–23)
Calcium: 9.2 mg/dL (ref 8.4–10.5)
Creat: 0.69 mg/dL (ref 0.50–1.10)
Glucose, Bld: 79 mg/dL (ref 70–99)
Potassium: 3.9 mEq/L (ref 3.5–5.3)

## 2011-08-25 NOTE — Telephone Encounter (Signed)
appt set for abd ultrasound on 08/29/11 @ 9:15am.  Patient informed.

## 2011-08-26 ENCOUNTER — Encounter: Payer: Self-pay | Admitting: *Deleted

## 2011-08-26 NOTE — Progress Notes (Signed)
Patient ID: Kaitlyn [name], female   DOB: [birth year], 26 y.o.   MRN: [id number] Pt called and left message she had question about her u/s appointment on Monday. Left message for pt to call.

## 2011-08-28 ENCOUNTER — Encounter (HOSPITAL_COMMUNITY): Payer: Self-pay | Admitting: *Deleted

## 2011-08-28 ENCOUNTER — Emergency Department (INDEPENDENT_AMBULATORY_CARE_PROVIDER_SITE_OTHER)
Admission: EM | Admit: 2011-08-28 | Discharge: 2011-08-28 | Disposition: A | Discharge status: Home / Self Care | Payer: 59 | Source: Home / Self Care

## 2011-08-28 DIAGNOSIS — J020 Streptococcal pharyngitis: Secondary | ICD-10-CM

## 2011-08-28 MED ORDER — PENICILLIN V POTASSIUM 500 MG PO TABS: 500.0000 mg | ORAL_TABLET | Freq: Three times a day (TID) | ORAL | Status: AC

## 2011-08-28 NOTE — ED Provider Notes (Signed)
History     CSN: [id number]  Arrival date & time 08/28/11  1740   None     Chief Complaint  Patient presents with  . Sore Throat  . Fever  . Nasal Congestion  . Cough    (Consider location/radiation/quality/duration/timing/severity/associated sxs/prior treatment) HPI Comments: Pt with cough and mild nasal congestion.  Has sore throat and has felt had a fever but didn't take temp. Hasn't taken any fever reducing medicine.  Worried has strep because has white spots on throat  Patient is a 26 y.o. female presenting with pharyngitis and fever. The history is provided by the patient.  Sore Throat This is a new problem. The current episode started yesterday. The problem occurs constantly. The problem has not changed since onset.Pertinent negatives include no abdominal pain. The symptoms are aggravated by swallowing. The symptoms are relieved by nothing. Treatments tried: throat lozenges. Improvement on treatment: transient mild relief.  Fever Primary symptoms of the febrile illness include fever and cough. Primary symptoms do not include abdominal pain. The current episode started yesterday. This is a new problem. The problem has not changed since onset. The fever began yesterday. The fever has been unchanged since its onset. The maximum temperature recorded prior to her arrival was unknown.  The cough began yesterday. The cough is non-productive.    Past Medical History  Diagnosis Date  . Bipolar disorder   . Anemia   . Endometriosis     ENDOMETRIOSIS???    Past Surgical History  Procedure Date  . Cesarean section   . Pelvic laparoscopy     Family History  Problem Relation Age of Onset  . Diabetes Mother   . Hypertension Mother   . Heart disease Mother     History  Substance Use Topics  . Smoking status: Never Smoker   . Smokeless tobacco: Never Used  . Alcohol Use: No    OB History    Grav Para Term Preterm Abortions TAB SAB Ect Mult Living   1 1 1       1        Review of Systems  Constitutional: Positive for fever and chills.  HENT: Positive for congestion and sore throat. Negative for postnasal drip.   Respiratory: Positive for cough.   Gastrointestinal: Negative for abdominal pain.    Allergies  Contrast media; Iohexol; and Lamotrigine  Home Medications   Current Outpatient Rx  Name Route Sig Dispense Refill  . PRENATAL PLUS 27-1 MG PO TABS Oral Take 1 tablet by mouth daily.      Marland Kitchen CITALOPRAM HYDROBROMIDE 10 MG PO TABS  Dose unknown     . DIVALPROEX SODIUM ER 500 MG PO TB24 Oral Take 500 mg by mouth 3 (three) times daily.      Marland Kitchen FLUCONAZOLE 150 MG PO TABS Oral Take 150 mg by mouth daily. then again in 3 days     . FLUTICASONE PROPIONATE 0.05 % EX CREA Topical Apply topically 2 (two) times daily. 30 g 0  . IPRATROPIUM BROMIDE 0.06 % NA SOLN Nasal Place 2 sprays into the nose 4 (four) times daily. 15 mL 1  . NORGESTIM-ETH ESTRAD TRIPHASIC 0.18/0.215/0.25 MG-35 MCG PO TABS Oral Take by mouth daily.      . OXYBUTYNIN CHLORIDE ER 10 MG PO TB24 Oral Take 1 tablet (10 mg total) by mouth daily. 30 tablet 2  . PENICILLIN V POTASSIUM 500 MG PO TABS Oral Take 1 tablet (500 mg total) by mouth 3 (three) times daily. 30  tablet 0  . RANITIDINE HCL 150 MG PO TABS Oral Take 150 mg by mouth 2 (two) times daily. for GERD       BP 142/81  Pulse 105  Temp(Src) 99.7 F (37.6 C) (Oral)  Resp 24  SpO2 100%  LMP 08/09/2011  Physical Exam  Constitutional: She appears well-developed and well-nourished. No distress.  HENT:  Right Ear: Tympanic membrane, external ear and ear canal normal.  Left Ear: Tympanic membrane, external ear and ear canal normal.  Nose: Mucosal edema present.  Mouth/Throat: Oropharyngeal exudate, posterior oropharyngeal edema and posterior oropharyngeal erythema present. No tonsillar abscesses.  Cardiovascular: Normal rate and regular rhythm.   Pulmonary/Chest: Effort normal and breath sounds normal.  Lymphadenopathy:        Head (right side): Submandibular adenopathy present.       Head (left side): Submandibular adenopathy present.    ED Course  Procedures (including critical care time)  Labs Reviewed  POCT RAPID STREP A ([facility] CARE ONLY) - Abnormal; Notable for the following:    Streptococcus, Group A Screen (Direct) POSITIVE (*)    All other components within normal limits   No results found.   1. Strep pharyngitis     Pt not pregnant, taking prenatal vitamins because is trying to get pregnant.   MDM          Cathlyn Parsons, NP 08/28/11 2126

## 2011-08-28 NOTE — ED Notes (Signed)
Pt with c/o sore throat/congestion./?fever/onset yesterday - cough onset today -

## 2011-08-29 ENCOUNTER — Ambulatory Visit (HOSPITAL_COMMUNITY): Payer: 59

## 2011-08-29 NOTE — ED Provider Notes (Signed)
Medical screening examination/treatment/procedure(s) were performed by non-physician practitioner and as supervising physician I was immediately available for consultation/collaboration.   Makynleigh Breslin DOUGLAS MD.    Bee Marchiano Douglas Jaclyn Andy, MD 08/29/11 1541 

## 2011-09-01 ENCOUNTER — Ambulatory Visit (HOSPITAL_COMMUNITY)
Admission: RE | Admit: 2011-09-01 | Discharge: 2011-09-01 | Disposition: A | Discharge status: Home / Self Care | Payer: 59 | Source: Ambulatory Visit | Attending: Gynecology | Admitting: Gynecology

## 2011-09-01 DIAGNOSIS — R1011 Right upper quadrant pain: Secondary | ICD-10-CM | POA: Insufficient documentation

## 2012-05-05 ENCOUNTER — Emergency Department (INDEPENDENT_AMBULATORY_CARE_PROVIDER_SITE_OTHER)
Admission: EM | Admit: 2012-05-05 | Discharge: 2012-05-05 | Disposition: A | Discharge status: Home / Self Care | Payer: Self-pay | Source: Home / Self Care | Attending: Family Medicine | Admitting: Family Medicine

## 2012-05-05 ENCOUNTER — Encounter (HOSPITAL_COMMUNITY): Payer: Self-pay | Admitting: Emergency Medicine

## 2012-05-05 DIAGNOSIS — J020 Streptococcal pharyngitis: Secondary | ICD-10-CM

## 2012-05-05 MED ORDER — CEFDINIR 300 MG PO CAPS
300.0000 mg | ORAL_CAPSULE | Freq: Two times a day (BID) | ORAL | Status: DC
Start: 2012-05-05 — End: 2012-06-26

## 2012-05-05 NOTE — ED Notes (Signed)
Sore throat , onset 04/30/2012, reports neck soreness, no cough, no fever, reports throat is painful to eating and drinking

## 2012-05-05 NOTE — ED Provider Notes (Signed)
History     CSN: [id number]  Arrival date & time 05/05/12  1458   First MD Initiated Contact with Patient 05/05/12 1509      Chief Complaint  Patient presents with  . Sore Throat    (Consider location/radiation/quality/duration/timing/severity/associated sxs/prior treatment) Patient is a 26 y.o. female presenting with pharyngitis. The history is provided by the patient.  Sore Throat This is a new problem. The current episode started more than 2 days ago. The problem has been gradually worsening. The symptoms are aggravated by swallowing.    Past Medical History  Diagnosis Date  . Bipolar disorder   . Anemia   . Endometriosis     ENDOMETRIOSIS???    Past Surgical History  Procedure Date  . Cesarean section   . Pelvic laparoscopy     Family History  Problem Relation Age of Onset  . Diabetes Mother   . Hypertension Mother   . Heart disease Mother     History  Substance Use Topics  . Smoking status: Never Smoker   . Smokeless tobacco: Never Used  . Alcohol Use: No    OB History    Grav Para Term Preterm Abortions TAB SAB Ect Mult Living   1 1 1       1       Review of Systems  Constitutional: Positive for appetite change. Negative for fever.  HENT: Positive for sore throat. Negative for congestion, rhinorrhea and mouth sores.   Skin: Negative for rash.    Allergies  Contrast media; Iohexol; and Lamotrigine  Home Medications   Current Outpatient Rx  Name Route Sig Dispense Refill  . CEFDINIR 300 MG PO CAPS Oral Take 1 capsule (300 mg total) by mouth 2 (two) times daily. 20 capsule 0  . CITALOPRAM HYDROBROMIDE 10 MG PO TABS  Dose unknown     . DIVALPROEX SODIUM ER 500 MG PO TB24 Oral Take 500 mg by mouth 3 (three) times daily.      Marland Kitchen FLUCONAZOLE 150 MG PO TABS Oral Take 150 mg by mouth daily. then again in 3 days     . FLUTICASONE PROPIONATE 0.05 % EX CREA Topical Apply topically 2 (two) times daily. 30 g 0  . IPRATROPIUM BROMIDE 0.06 % NA SOLN Nasal  Place 2 sprays into the nose 4 (four) times daily. 15 mL 1  . NORGESTIM-ETH ESTRAD TRIPHASIC 0.18/0.215/0.25 MG-35 MCG PO TABS Oral Take by mouth daily.      . OXYBUTYNIN CHLORIDE ER 10 MG PO TB24 Oral Take 1 tablet (10 mg total) by mouth daily. 30 tablet 2  . PRENATAL PLUS 27-1 MG PO TABS Oral Take 1 tablet by mouth daily.      Marland Kitchen RANITIDINE HCL 150 MG PO TABS Oral Take 150 mg by mouth 2 (two) times daily. for GERD       BP 124/72  Pulse 106  Temp 99.7 F (37.6 C) (Oral)  Resp 18  SpO2 100%  LMP 04/15/2012  Physical Exam  Nursing note and vitals reviewed. Constitutional: [name], place, and time. She appears well-developed and well-nourished.  HENT:  Head: Normocephalic.  Right Ear: External ear normal.  Left Ear: External ear normal.  Mouth/Throat: Mucous membranes are normal. Posterior oropharyngeal erythema present. No oropharyngeal exudate.  Eyes: Pupils are equal, round, and reactive to light.  Neck: Normal range of motion. Neck supple.  Lymphadenopathy:    She has cervical adenopathy.  Neurological: She is alert and oriented to person, place,  and time.  Skin: Skin is warm and dry.    ED Course  Procedures (including critical care time)  Labs Reviewed  POCT RAPID STREP A ([facility] CARE ONLY) - Abnormal; Notable for the following:    Streptococcus, Group A Screen (Direct) POSITIVE (*)     All other components within normal limits   No results found.   1. Strep pharyngitis       MDM  Strep pos.        Linna Hoff, MD 05/05/12 [phone]

## 2012-06-26 ENCOUNTER — Emergency Department (HOSPITAL_COMMUNITY)
Admission: EM | Admit: 2012-06-26 | Discharge: 2012-06-26 | Disposition: A | Discharge status: Home / Self Care | Payer: Self-pay | Attending: Emergency Medicine | Admitting: Emergency Medicine

## 2012-06-26 ENCOUNTER — Emergency Department (HOSPITAL_COMMUNITY): Payer: Self-pay

## 2012-06-26 ENCOUNTER — Encounter (HOSPITAL_COMMUNITY): Payer: Self-pay | Admitting: *Deleted

## 2012-06-26 DIAGNOSIS — M25519 Pain in unspecified shoulder: Secondary | ICD-10-CM | POA: Insufficient documentation

## 2012-06-26 DIAGNOSIS — Z760 Encounter for issue of repeat prescription: Secondary | ICD-10-CM | POA: Insufficient documentation

## 2012-06-26 DIAGNOSIS — R071 Chest pain on breathing: Secondary | ICD-10-CM | POA: Insufficient documentation

## 2012-06-26 DIAGNOSIS — G8929 Other chronic pain: Secondary | ICD-10-CM | POA: Insufficient documentation

## 2012-06-26 DIAGNOSIS — Z862 Personal history of diseases of the blood and blood-forming organs and certain disorders involving the immune mechanism: Secondary | ICD-10-CM | POA: Insufficient documentation

## 2012-06-26 DIAGNOSIS — Z8742 Personal history of other diseases of the female genital tract: Secondary | ICD-10-CM | POA: Insufficient documentation

## 2012-06-26 DIAGNOSIS — F319 Bipolar disorder, unspecified: Secondary | ICD-10-CM | POA: Insufficient documentation

## 2012-06-26 MED ORDER — KETOROLAC TROMETHAMINE 60 MG/2ML IM SOLN
60.0000 mg | Freq: Once | INTRAMUSCULAR | Status: AC
  Administered 2012-06-26: 60 mg via INTRAMUSCULAR
  Filled 2012-06-26: qty 2

## 2012-06-26 MED ORDER — CYCLOBENZAPRINE HCL 10 MG PO TABS: 10.0000 mg | ORAL_TABLET | Freq: Two times a day (BID) | ORAL | Status: DC | PRN

## 2012-06-26 MED ORDER — IBUPROFEN 600 MG PO TABS: 600.0000 mg | ORAL_TABLET | Freq: Four times a day (QID) | ORAL | Status: DC | PRN

## 2012-06-26 MED ORDER — CYCLOBENZAPRINE HCL 10 MG PO TABS
5.0000 mg | ORAL_TABLET | Freq: Once | ORAL | Status: DC
  Filled 2012-06-26: qty 1
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
## 2012-06-26 NOTE — ED Notes (Signed)
Dr Yelverton at bedside.  

## 2012-06-26 NOTE — ED Notes (Signed)
Pt in radiology 

## 2012-06-26 NOTE — ED Notes (Signed)
Pt ambulatory ED with family. Pt given d/c instructions and prescriptions. Pt does not appear to be in any acute distress upon d/c. Pt does not have any questions upon d/c.

## 2012-06-26 NOTE — ED Notes (Signed)
Pt denies nausea; denies shortness of breath; pt denies difficulty breathing; pt does not appear to be having acute reaction to the IM injection. Pt states decrease in pain

## 2012-06-26 NOTE — ED Provider Notes (Signed)
History  This chart was scribed for Kaitlyn Racer, MD by Manuela Schwartz, ED scribe. This patient was seen in room TR09C/TR09C and the patient's care was started at 1631.   CSN: [id number]  Arrival date & time 06/26/12  1631   First MD Initiated Contact with Patient 06/26/12 1659      Chief Complaint  Patient presents with  . Shoulder Pain  . Chest wall pain   . Medication Refill   The history is provided by the patient. No language interpreter was used.   JELESA [name] is a 26 y.o. female who presents to the Emergency Department complaining of cosntant midsternal chest pain ongoing for the past year but worse now since has recently run out of flexeril for her pain. She states pain is worse when she presses on her chest. She denies SOB, calf swelling/tenderness, cough, fever.   Past Medical History  Diagnosis Date  . Bipolar disorder   . Anemia   . Endometriosis     ENDOMETRIOSIS???    Past Surgical History  Procedure Date  . Cesarean section   . Pelvic laparoscopy     Family History  Problem Relation Age of Onset  . Diabetes Mother   . Hypertension Mother   . Heart disease Mother     History  Substance Use Topics  . Smoking status: Never Smoker   . Smokeless tobacco: Never Used  . Alcohol Use: No    OB History    Grav Para Term Preterm Abortions TAB SAB Ect Mult Living   1 1 1       1       Review of Systems  Constitutional: Negative for fever and chills.  Respiratory: Negative for shortness of breath.   Cardiovascular: Positive for chest pain (mid sternal CP).  Gastrointestinal: Negative for nausea and vomiting.  Neurological: Negative for weakness.  All other systems reviewed and are negative.    Allergies  Contrast media; Iohexol; and Lamotrigine  Home Medications   Current Outpatient Rx  Name  Route  Sig  Dispense  Refill  . CYCLOBENZAPRINE HCL 10 MG PO TABS   Oral   Take 1 tablet (10 mg total) by mouth 2 (two) times daily as needed for  muscle spasms.   20 tablet   0   . IBUPROFEN 600 MG PO TABS   Oral   Take 1 tablet (600 mg total) by mouth every 6 (six) hours as needed for pain.   30 tablet   0     Triage Vitals: BP 126/91  Temp 98.3 F (36.8 C) (Oral)  Resp 22  SpO2 100%  LMP 06/17/2012  Physical Exam  Nursing note and vitals reviewed. Constitutional: [name], place, and time. She appears well-developed and well-nourished. No distress.  HENT:  Head: Normocephalic and atraumatic.  Eyes: EOM are normal.  Neck: Neck supple. No tracheal deviation present.  Cardiovascular: Normal rate, regular rhythm and normal heart sounds.   Pulmonary/Chest: Effort normal and breath sounds normal. No respiratory distress. She has no wheezes. She has no rales.       Reproducible chest wall tenderness pain and also tender over left anterior shoulder.   Musculoskeletal: Normal range of motion. She exhibits no edema and no tenderness.       No swelling of calfs and non tender  Neurological: She is alert and oriented to person, place, and time.  Skin: Skin is warm and dry.  Psychiatric: She has a normal mood  and affect. Her behavior is normal.    ED Course  Procedures (including critical care time) DIAGNOSTIC STUDIES: Oxygen Saturation is 100% on room air, normal by my interpretation.    COORDINATION OF CARE: At 530 PM Discussed treatment plan with patient which includes toradol, flexeril, CXR, EKG. Patient agrees.   Labs Reviewed - No data to display Dg Chest 2 View  06/26/2012  *RADIOLOGY REPORT*  Clinical Data: Chest and right shoulder pain  CHEST - 2 VIEW  Comparison: None  Findings: Normal heart size, mediastinal contours, and pulmonary vascularity. Lungs clear. Biconvex thoracolumbar scoliosis. No pleural effusion or pneumothorax.  IMPRESSION: Scoliosis. No acute abnormalities.   Original Report Authenticated By: Ulyses Southward, M.D.      1. Chronic chest wall pain       MDM  I personally  performed the services described in this documentation, which was scribed in my presence. The recorded information has been reviewed and is accurate.           Kaitlyn Racer, MD 06/27/12 2204

## 2012-10-24 ENCOUNTER — Emergency Department (INDEPENDENT_AMBULATORY_CARE_PROVIDER_SITE_OTHER)
Admission: EM | Admit: 2012-10-24 | Discharge: 2012-10-24 | Disposition: A | Discharge status: Home / Self Care | Payer: Self-pay | Source: Home / Self Care | Attending: Family Medicine | Admitting: Family Medicine

## 2012-10-24 ENCOUNTER — Encounter (HOSPITAL_COMMUNITY): Payer: Self-pay | Admitting: Emergency Medicine

## 2012-10-24 DIAGNOSIS — M654 Radial styloid tenosynovitis [de Quervain]: Secondary | ICD-10-CM

## 2012-10-24 MED ORDER — TRAMADOL HCL 50 MG PO TABS: 50.0000 mg | ORAL_TABLET | Freq: Four times a day (QID) | ORAL | Status: DC | PRN

## 2012-10-24 MED ORDER — IBUPROFEN 600 MG PO TABS: 600.0000 mg | ORAL_TABLET | Freq: Three times a day (TID) | ORAL | Status: DC | PRN

## 2012-10-24 MED ORDER — PREDNISONE 20 MG PO TABS: ORAL_TABLET | ORAL | Status: DC

## 2012-10-24 NOTE — ED Notes (Signed)
Dr Alfonse Ras evaluated patient prior to this nurse.  Wrist pain started one week ago , no known injury
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
## 2012-11-17 IMAGING — RF DG HYSTEROGRAM
4 series · 4 of 4 positions shown · IV contrast (omnipaque)
Comparison: none

CLINICAL DATA: Infertility

HYSTEROSALPINGOGRAM
TECHNIQUE: Following cleansing of the cervix and vagina with
Betadine solution, a hysterosalpingogram was performed using a 5-
French hysterosalpingogram catheter and Omnipaque 300 contrast.
The patient tolerated the examination without difficulty.

[Series 1: run · 1 of 1 slices shown (1 of 4)]
[im 1/1]
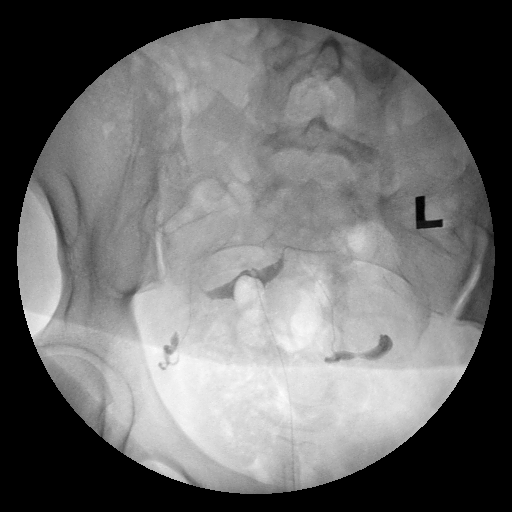

[Series 2: run · 1 of 1 slices shown (2 of 4)]
[im 1/1]
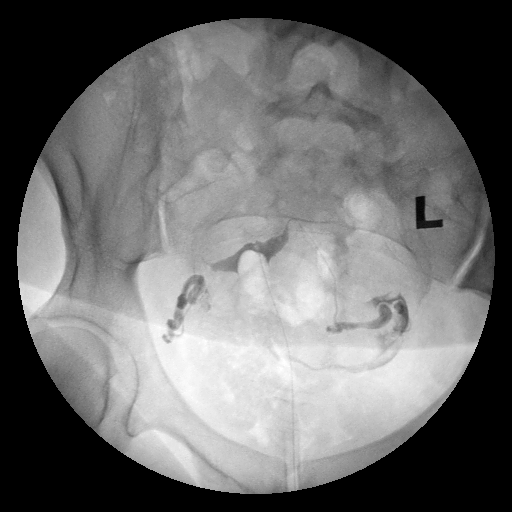

[Series 3: run · 1 of 1 slices shown (3 of 4)]
[im 1/1]
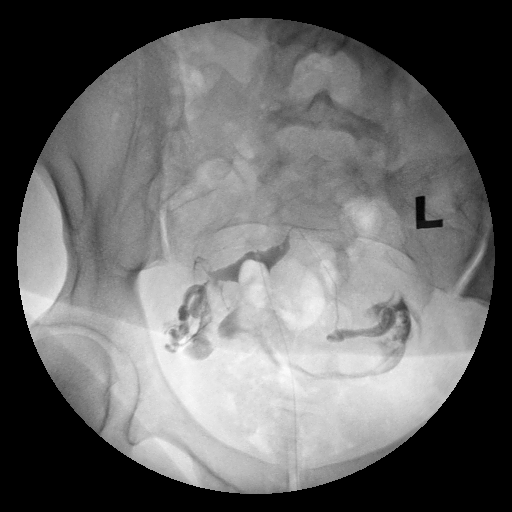

[Series 4: run · 1 of 1 slices shown (4 of 4)]
[im 1/1]
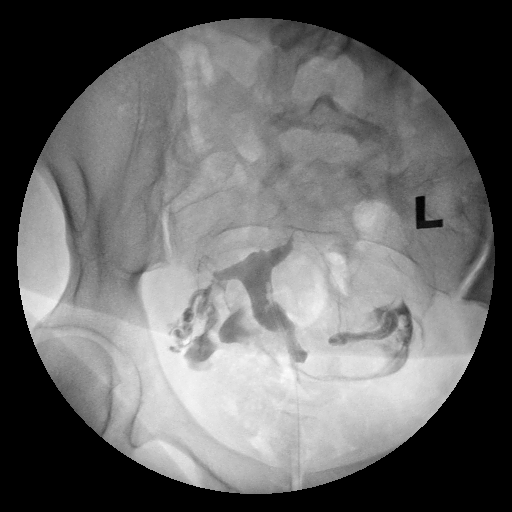

[4 of 4 positions shown; findings below may reference images not displayed]

FINDINGS: The uterus has a normal contour and appearance with no
fixed filling defects.  Both fallopian tubes fill and have a normal
appearance, as well.  There is free spill of contrast into both
adnexal regions.
IMPRESSION: Normal HSG.

## 2013-12-19 ENCOUNTER — Encounter: Payer: Self-pay | Admitting: Internal Medicine

## 2013-12-19 ENCOUNTER — Other Ambulatory Visit (HOSPITAL_COMMUNITY)
Admission: RE | Admit: 2013-12-19 | Discharge: 2013-12-19 | Disposition: A | Discharge status: Home / Self Care | Payer: No Typology Code available for payment source | Source: Ambulatory Visit | Attending: Internal Medicine | Admitting: Internal Medicine

## 2013-12-19 ENCOUNTER — Ambulatory Visit: Payer: No Typology Code available for payment source | Attending: Internal Medicine | Admitting: Internal Medicine

## 2013-12-19 VITALS — BP 115/82 | HR 78 | Temp 98.4°F | Resp 17 | Wt 227.6 lb

## 2013-12-19 DIAGNOSIS — Z888 Allergy status to other drugs, medicaments and biological substances status: Secondary | ICD-10-CM | POA: Insufficient documentation

## 2013-12-19 DIAGNOSIS — F319 Bipolar disorder, unspecified: Secondary | ICD-10-CM | POA: Insufficient documentation

## 2013-12-19 DIAGNOSIS — N760 Acute vaginitis: Secondary | ICD-10-CM | POA: Insufficient documentation

## 2013-12-19 DIAGNOSIS — Z113 Encounter for screening for infections with a predominantly sexual mode of transmission: Secondary | ICD-10-CM | POA: Insufficient documentation

## 2013-12-19 DIAGNOSIS — Z01419 Encounter for gynecological examination (general) (routine) without abnormal findings: Secondary | ICD-10-CM | POA: Insufficient documentation

## 2013-12-19 DIAGNOSIS — M549 Dorsalgia, unspecified: Secondary | ICD-10-CM

## 2013-12-19 DIAGNOSIS — Z0000 Encounter for general adult medical examination without abnormal findings: Secondary | ICD-10-CM

## 2013-12-19 DIAGNOSIS — Z124 Encounter for screening for malignant neoplasm of cervix: Secondary | ICD-10-CM

## 2013-12-19 DIAGNOSIS — B372 Candidiasis of skin and nail: Secondary | ICD-10-CM

## 2013-12-19 DIAGNOSIS — D649 Anemia, unspecified: Secondary | ICD-10-CM | POA: Insufficient documentation

## 2013-12-19 DIAGNOSIS — Z79899 Other long term (current) drug therapy: Secondary | ICD-10-CM | POA: Insufficient documentation

## 2013-12-19 DIAGNOSIS — Z91041 Radiographic dye allergy status: Secondary | ICD-10-CM | POA: Insufficient documentation

## 2013-12-19 LAB — CBC
HCT: 36 % (ref 36.0–46.0)
Hemoglobin: 11.6 g/dL — ABNORMAL LOW (ref 12.0–15.0)
MCH: 27.6 pg (ref 26.0–34.0)
MCHC: 32.2 g/dL (ref 30.0–36.0)
MCV: 85.5 fL (ref 78.0–100.0)
PLATELETS: 259 10*3/uL (ref 150–400)
RBC: 4.21 MIL/uL (ref 3.87–5.11)
RDW: 14.7 % (ref 11.5–15.5)
WBC: 3.6 10*3/uL — ABNORMAL LOW (ref 4.0–10.5)

## 2013-12-19 LAB — LIPID PANEL
CHOL/HDL RATIO: 3.8 ratio
Cholesterol: 150 mg/dL (ref 0–200)
HDL: 40 mg/dL (ref >39)
LDL CALC: 102 mg/dL — AB (ref 0–99)
TRIGLYCERIDES: 42 mg/dL (ref <150)
VLDL: 8 mg/dL (ref 0–40)

## 2013-12-19 LAB — COMPLETE METABOLIC PANEL WITH GFR
ALK PHOS: 74 U/L (ref 39–117)
ALT: 11 U/L (ref 0–35)
AST: 18 U/L (ref 0–37)
Albumin: 3.9 g/dL (ref 3.5–5.2)
BILIRUBIN TOTAL: 0.4 mg/dL (ref 0.2–1.2)
BUN: 9 mg/dL (ref 6–23)
CO2: 24 meq/L (ref 19–32)
CREATININE: 0.65 mg/dL (ref 0.50–1.10)
Calcium: 9 mg/dL (ref 8.4–10.5)
Chloride: 104 mEq/L (ref 96–112)
Glucose, Bld: 81 mg/dL (ref 70–99)
Potassium: 4.1 mEq/L (ref 3.5–5.3)
Sodium: 138 mEq/L (ref 135–145)
Total Protein: 7.3 g/dL (ref 6.0–8.3)

## 2013-12-19 LAB — HEMOGLOBIN A1C
Hgb A1c MFr Bld: 5.2 % (ref <5.7)
Mean Plasma Glucose: 103 mg/dL (ref <117)

## 2013-12-19 LAB — TSH: TSH: 1.221 u[IU]/mL (ref 0.350–4.500)

## 2013-12-19 MED ORDER — CYCLOBENZAPRINE HCL 10 MG PO TABS: 10.0000 mg | ORAL_TABLET | Freq: Two times a day (BID) | ORAL | Status: DC | PRN

## 2013-12-19 MED ORDER — MELOXICAM 15 MG PO TABS: 15.0000 mg | ORAL_TABLET | Freq: Every day | ORAL | Status: DC

## 2013-12-19 MED ORDER — NYSTATIN 100000 UNIT/GM EX POWD: CUTANEOUS | Status: DC

## 2013-12-19 NOTE — Progress Notes (Signed)
Patient here to establish care Here for a pap smear Takes no prescribed medications
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
## 2013-12-19 NOTE — Patient Instructions (Signed)
DASH Diet The DASH diet stands for "Dietary Approaches to Stop Hypertension." It is a healthy eating plan that has been shown to reduce high blood pressure (hypertension) in as little as 14 days, while also possibly providing other significant health benefits. These other health benefits include reducing the risk of breast cancer after menopause and reducing the risk of type 2 diabetes, heart disease, colon cancer, and stroke. Health benefits also include weight loss and slowing kidney failure in patients with chronic kidney disease.  DIET GUIDELINES  Limit salt (sodium). Your diet should contain less than 1500 mg of sodium daily.  Limit refined or processed carbohydrates. Your diet should include mostly whole grains. Desserts and added sugars should be used sparingly.  Include small amounts of heart-healthy fats. These types of fats include nuts, oils, and tub margarine. Limit saturated and trans fats. These fats have been shown to be harmful in the body. CHOOSING FOODS  The following food groups are based on a 2000 calorie diet. See your Registered Dietitian for individual calorie needs. Grains and Grain Products (6 to 8 servings daily)  Eat More Often: Whole-wheat bread, brown rice, whole-grain or wheat pasta, quinoa, popcorn without added fat or salt (air popped).  Eat Less Often: White bread, white pasta, white rice, cornbread. Vegetables (4 to 5 servings daily)  Eat More Often: Fresh, frozen, and canned vegetables. Vegetables may be raw, steamed, roasted, or grilled with a minimal amount of fat.  Eat Less Often/Avoid: Creamed or fried vegetables. Vegetables in a cheese sauce. Fruit (4 to 5 servings daily)  Eat More Often: All fresh, canned (in natural juice), or frozen fruits. Dried fruits without added sugar. One hundred percent fruit juice ( cup [237 mL] daily).  Eat Less Often: Dried fruits with added sugar. Canned fruit in light or heavy syrup. Lean Meats, Fish, and Poultry (2  servings or less daily. One serving is 3 to 4 oz [85-114 g]).  Eat More Often: Ninety percent or leaner ground beef, tenderloin, sirloin. Round cuts of beef, chicken breast, turkey breast. All fish. Grill, bake, or broil your meat. Nothing should be fried.  Eat Less Often/Avoid: Fatty cuts of meat, turkey, or chicken leg, thigh, or wing. Fried cuts of meat or fish. Dairy (2 to 3 servings)  Eat More Often: Low-fat or fat-free milk, low-fat plain or light yogurt, reduced-fat or part-skim cheese.  Eat Less Often/Avoid: Milk (whole, 2%).Whole milk yogurt. Full-fat cheeses. Nuts, Seeds, and Legumes (4 to 5 servings per week)  Eat More Often: All without added salt.  Eat Less Often/Avoid: Salted nuts and seeds, canned beans with added salt. Fats and Sweets (limited)  Eat More Often: Vegetable oils, tub margarines without trans fats, sugar-free gelatin. Mayonnaise and salad dressings.  Eat Less Often/Avoid: Coconut oils, palm oils, butter, stick margarine, cream, half and half, cookies, candy, pie. FOR MORE INFORMATION The Dash Diet Eating Plan: www.dashdiet.org Document Released: 06/23/2011 Document Revised: 09/26/2011 Document Reviewed: 06/23/2011 ExitCare Patient Information 2014 [employer]. Exercise to Lose Weight Exercise and a healthy diet may help you lose weight. Your doctor may suggest specific exercises. EXERCISE IDEAS AND TIPS  Choose low-cost things you enjoy doing, such as walking, bicycling, or exercising to workout videos.  Take stairs instead of the elevator.  Walk during your lunch break.  Park your car further away from work or school.  Go to a gym or an exercise class.  Start with 5 to 10 minutes of exercise each day. Build up to 30 minutes   of exercise 4 to 6 days a week.  Wear shoes with good support and comfortable clothes.  Stretch before and after working out.  Work out until you breathe harder and your heart beats faster.  Drink extra water when  you exercise.  Do not do so much that you hurt yourself, feel dizzy, or get very short of breath. Exercises that burn about 150 calories:  Running 1  miles in 15 minutes.  Playing volleyball for 45 to 60 minutes.  Washing and waxing a car for 45 to 60 minutes.  Playing touch football for 45 minutes.  Walking 1  miles in 35 minutes.  Pushing a stroller 1  miles in 30 minutes.  Playing basketball for 30 minutes.  Raking leaves for 30 minutes.  Bicycling 5 miles in 30 minutes.  Walking 2 miles in 30 minutes.  Dancing for 30 minutes.  Shoveling snow for 15 minutes.  Swimming laps for 20 minutes.  Walking up stairs for 15 minutes.  Bicycling 4 miles in 15 minutes.  Gardening for 30 to 45 minutes.  Jumping rope for 15 minutes.  Washing windows or floors for 45 to 60 minutes. Document Released: 08/06/2010 Document Revised: 09/26/2011 Document Reviewed: 08/06/2010 ExitCare Patient Information 2014 [employer].  

## 2013-12-20 LAB — RPR

## 2013-12-20 LAB — CYTOLOGY - PAP

## 2013-12-20 LAB — VITAMIN D 25 HYDROXY (VIT D DEFICIENCY, FRACTURES): Vit D, 25-Hydroxy: 21 ng/mL — ABNORMAL LOW (ref 30–89)

## 2013-12-20 LAB — HIV ANTIBODY (ROUTINE TESTING W REFLEX): HIV: NONREACTIVE

## 2013-12-24 ENCOUNTER — Telehealth: Payer: Self-pay | Admitting: *Deleted

## 2013-12-24 ENCOUNTER — Other Ambulatory Visit: Payer: Self-pay | Admitting: *Deleted

## 2013-12-24 DIAGNOSIS — N760 Acute vaginitis: Principal | ICD-10-CM

## 2013-12-24 DIAGNOSIS — B9689 Other specified bacterial agents as the cause of diseases classified elsewhere: Secondary | ICD-10-CM

## 2013-12-24 MED ORDER — METRONIDAZOLE 500 MG PO TABS: 500.0000 mg | ORAL_TABLET | Freq: Two times a day (BID) | ORAL | Status: AC

## 2013-12-24 NOTE — Telephone Encounter (Signed)
Please put in referral for patient to have sleep study and let her know. Thanks.

## 2013-12-24 NOTE — Telephone Encounter (Signed)
Lab results and instructions reviewed with patient. Patient requesting sleep study for sleep apnea. States she discussed with this at last visit with provider.

## 2013-12-24 NOTE — Telephone Encounter (Signed)
VM message left for patient to return call. Rx for Metronidazole 500 mg BID X 7 days e-scribed to [facility]. Kinnie Feil, RN

## 2013-12-24 NOTE — Telephone Encounter (Signed)
Message copied by Fredderick Severance on Tue Dec 24, 2013 12:57 PM ------      Message from: Ambrose Finland      Created: Sun Dec 22, 2013  6:36 PM       Let patient know STD, HIV, and syphilis are negative. Pap is normal will not need repeat pap for 3 years. Patient tested positive for BV, please send metronidazole 500 mg BID for 7 days, no alcohol while using this medication. She will also need so OTC vitamin D 1,000 IU to take daily. Thanks ------

## 2013-12-25 ENCOUNTER — Other Ambulatory Visit: Payer: Self-pay | Admitting: Emergency Medicine

## 2013-12-25 ENCOUNTER — Telehealth: Payer: Self-pay | Admitting: Emergency Medicine

## 2013-12-25 DIAGNOSIS — G4730 Sleep apnea, unspecified: Secondary | ICD-10-CM

## 2013-12-25 NOTE — Telephone Encounter (Signed)
Left message with pt mother to call clinic in regards to sleep study

## 2014-01-11 ENCOUNTER — Emergency Department (HOSPITAL_COMMUNITY)
Admission: EM | Admit: 2014-01-11 | Discharge: 2014-01-11 | Disposition: A | Discharge status: Home / Self Care | Payer: No Typology Code available for payment source | Attending: Emergency Medicine | Admitting: Emergency Medicine

## 2014-01-11 ENCOUNTER — Encounter (HOSPITAL_COMMUNITY): Payer: Self-pay | Admitting: Emergency Medicine

## 2014-01-11 ENCOUNTER — Emergency Department (HOSPITAL_COMMUNITY): Payer: No Typology Code available for payment source

## 2014-01-11 DIAGNOSIS — R55 Syncope and collapse: Secondary | ICD-10-CM

## 2014-01-11 DIAGNOSIS — Z8659 Personal history of other mental and behavioral disorders: Secondary | ICD-10-CM | POA: Insufficient documentation

## 2014-01-11 DIAGNOSIS — Z862 Personal history of diseases of the blood and blood-forming organs and certain disorders involving the immune mechanism: Secondary | ICD-10-CM | POA: Insufficient documentation

## 2014-01-11 DIAGNOSIS — Z3202 Encounter for pregnancy test, result negative: Secondary | ICD-10-CM | POA: Insufficient documentation

## 2014-01-11 DIAGNOSIS — Z791 Long term (current) use of non-steroidal anti-inflammatories (NSAID): Secondary | ICD-10-CM | POA: Insufficient documentation

## 2014-01-11 DIAGNOSIS — Z8742 Personal history of other diseases of the female genital tract: Secondary | ICD-10-CM | POA: Insufficient documentation

## 2014-01-11 DIAGNOSIS — Z792 Long term (current) use of antibiotics: Secondary | ICD-10-CM | POA: Insufficient documentation

## 2014-01-11 LAB — RAPID URINE DRUG SCREEN, HOSP PERFORMED
Amphetamines: NOT DETECTED
Barbiturates: NOT DETECTED
Benzodiazepines: NOT DETECTED
Cocaine: NOT DETECTED
OPIATES: NOT DETECTED
Tetrahydrocannabinol: NOT DETECTED

## 2014-01-11 LAB — POC URINE PREG, ED: PREG TEST UR: NEGATIVE

## 2014-01-11 LAB — I-STAT CHEM 8, ED
BUN: 8 mg/dL (ref 6–23)
Calcium, Ion: 1.19 mmol/L (ref 1.12–1.23)
Chloride: 106 mEq/L (ref 96–112)
Creatinine, Ser: 0.7 mg/dL (ref 0.50–1.10)
Glucose, Bld: 83 mg/dL (ref 70–99)
HCT: 39 % (ref 36.0–46.0)
HEMOGLOBIN: 13.3 g/dL (ref 12.0–15.0)
Potassium: 4 mEq/L (ref 3.7–5.3)
SODIUM: 142 meq/L (ref 137–147)
TCO2: 22 mmol/L (ref 0–100)

## 2014-01-11 LAB — ETHANOL: Alcohol, Ethyl (B): <11 mg/dL (ref 0–11)

## 2014-01-11 MED ORDER — SODIUM CHLORIDE 0.9 % IV BOLUS (SEPSIS)
500.0000 mL | Freq: Once | INTRAVENOUS | Status: AC
  Administered 2014-01-11: 500 mL via INTRAVENOUS

## 2014-01-11 NOTE — ED Notes (Signed)
Pt states that she feels fine at this time , pt is c/o back pain but states that is chronic for her

## 2014-01-11 NOTE — ED Notes (Signed)
Patient transported to CT 

## 2014-01-11 NOTE — Discharge Instructions (Signed)
Please follow up with your neurologist.  Do not do any activities that would cause harm to yourself or others if you were to have a seizure.   Seizure, Adult A seizure means there is unusual activity in the brain. A seizure can cause changes in attention or behavior. Seizures often cause shaking (convulsions). Seizures often last from 30 seconds to 2 minutes. HOME CARE   If you are given medicines, take them exactly as told by your doctor.  Keep all doctor visits as told.  Do not swim or drive until your doctor says it is okay.  Teach others what to do if you have a seizure. They should:  Lay you on the ground.  Put a cushion under your head.  Loosen any tight clothing around your neck.  Turn you on your side.  Stay with you until you get better. GET HELP RIGHT AWAY IF:   The seizure lasts longer than 2 to 5 minutes.  The seizure is very bad.  The person does not wake up after the seizure.  The person's attention or behavior changes. Drive the person to the emergency room or call your local emergency services (911 in [place] MAKE SURE YOU:   Understand these instructions.  Will watch your condition.  Will get help right away if you are not doing well or get worse. Document Released: 12/21/2007 Document Revised: 09/26/2011 Document Reviewed: 06/22/2011 [facility] Patient Information 2015 [place], [place]. This information is not intended to replace advice given to you by your health care provider. Make sure you discuss any questions you have with your health care provider.

## 2014-01-11 NOTE — ED Notes (Signed)
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

## 2014-01-11 NOTE — ED Provider Notes (Signed)
CSN: [id number]     Arrival date & time 01/11/14  1143 History   First MD Initiated Contact with Patient 01/11/14 1251     Chief Complaint  Patient presents with  . Seizures     (Consider location/radiation/quality/duration/timing/severity/associated sxs/prior Treatment) HPI 28 year old female brought in via EMS with report that she may have had a seizure. I have obtained history from the patient he states that she was at work and it was hot inside and she stepped outside. She states that she then awoke in the EMS vehicle and route to the hospital. She describes this as similar to when she had pseudoseizures in the past. EMS reported to the nurse that she had a episode of falling to the ground with some minor episodes of muscle flexion. She was awake immediately upon their arrival and not confused. There is no biting of the tongue or loss of bladder control. Patient states that her previous diagnosis of pseudoseizures was made that the neurologist after she had multiple seizures following a head injury. She also states that she was on Tegretol for one year following this. She has had no recurrence of this in the past several years until today. She complains of ongoing low back pain and neck pain. She denies any other trauma or new injuries or pain. Past Medical History  Diagnosis Date  . Bipolar disorder   . Anemia   . Endometriosis     ENDOMETRIOSIS???   Past Surgical History  Procedure Laterality Date  . Cesarean section    . Pelvic laparoscopy     Family History  Problem Relation Age of Onset  . Diabetes Mother   . Hypertension Mother   . Heart disease Mother    History  Substance Use Topics  . Smoking status: Never Smoker   . Smokeless tobacco: Never Used  . Alcohol Use: No   OB History   Grav Para Term Preterm Abortions TAB SAB Ect Mult Living   1 1 1       1      Review of Systems  All other systems reviewed and are negative.     Allergies  Contrast media;  Iohexol; and Lamotrigine  Home Medications   Prior to Admission medications   Medication Sig Start Date End Date Taking? Authorizing Hester Forget  cyclobenzaprine (FLEXERIL) 10 MG tablet Take 10 mg by mouth 3 (three) times daily as needed for muscle spasms.   Yes Historical Judaea Burgoon, MD  meloxicam (MOBIC) 15 MG tablet Take 15 mg by mouth daily.   Yes Historical Donnamae Muilenburg, MD  metroNIDAZOLE (FLAGYL) 500 MG tablet Take 500 mg by mouth 2 (two) times daily.   Yes Historical Mackensi Mahadeo, MD   BP 137/73  Pulse 70  Temp(Src) 98.6 F (37 C) (Oral)  Resp 22  SpO2 98% Physical Exam  Nursing note and vitals reviewed. Constitutional: [name], place, and time. She appears well-developed and well-nourished.  HENT:  Head: Normocephalic and atraumatic.  Right Ear: External ear normal.  Left Ear: External ear normal.  Nose: Nose normal.  Mouth/Throat: Oropharynx is clear and moist.  Eyes: Conjunctivae and EOM are normal. Pupils are equal, round, and reactive to light.  Neck: Normal range of motion. Neck supple. No JVD present. No tracheal deviation present. No thyromegaly present.  Cardiovascular: Normal rate, regular rhythm, normal heart sounds and intact distal pulses.   Pulmonary/Chest: Effort normal and breath sounds normal. No respiratory distress. She has no wheezes.  Abdominal: Soft. Bowel sounds are  normal. She exhibits no mass. There is no tenderness. There is no guarding.  Musculoskeletal: Normal range of motion.  Lymphadenopathy:    She has no cervical adenopathy.  Neurological: She is alert and oriented to person, place, and time. She has normal reflexes. No cranial nerve deficit or sensory deficit. Gait normal. GCS eye subscore is 4. GCS verbal subscore is 5. GCS motor subscore is 6.  Reflex Scores:      Bicep reflexes are 2+ on the right side and 2+ on the left side.      Patellar reflexes are 2+ on the right side and 2+ on the left side. Strength is 5/5 bilateral elbow  flexor/extensors, wrist extension/flexion, intrinsic hand strength equal Bilateral hip flexion/extension 5/5, knee flexion/extension 5/5, ankle 5/5 flexion extension    Skin: Skin is warm and dry.  Psychiatric: She has a normal mood and affect. Her behavior is normal. Judgment and thought content normal.    ED Course  Procedures (including critical care time) Labs Review Labs Reviewed  URINE RAPID DRUG SCREEN (HOSP PERFORMED)  ETHANOL  I-STAT CHEM 8, ED  POC URINE PREG, ED    Imaging Review No results found.   EKG Interpretation None      MDM   Final diagnoses:  Syncope and collapse    28 year old female comes in today with an episode of loss of consciousness questionable seizure activity. History of pseudoseizures after a head injury. It is unclear exactly what happened today. However, she will be placed on seizure precautions. Her workup here is normal. She is advised to not drive or do anything else that could be dangerous if she were to have a seizure and to followup with the neurologist that she saw in the past. He was seen at for neurology. I am unable to access their notes the   Hilario Quarryanielle S Ray, MD 01/11/14 1725

## 2014-01-11 NOTE — ED Notes (Signed)
phlebotomy at bedside.  

## 2014-01-11 NOTE — ED Notes (Signed)
Returned from ct scan 

## 2014-02-04 ENCOUNTER — Emergency Department (HOSPITAL_COMMUNITY)
Admission: EM | Admit: 2014-02-04 | Discharge: 2014-02-05 | Disposition: A | Discharge status: Home / Self Care | Payer: Self-pay | Attending: Emergency Medicine | Admitting: Emergency Medicine

## 2014-02-04 ENCOUNTER — Encounter (HOSPITAL_COMMUNITY): Payer: Self-pay | Admitting: Radiology

## 2014-02-04 ENCOUNTER — Emergency Department (HOSPITAL_COMMUNITY): Payer: Self-pay

## 2014-02-04 DIAGNOSIS — Z79899 Other long term (current) drug therapy: Secondary | ICD-10-CM | POA: Insufficient documentation

## 2014-02-04 DIAGNOSIS — Z87891 Personal history of nicotine dependence: Secondary | ICD-10-CM | POA: Insufficient documentation

## 2014-02-04 DIAGNOSIS — R569 Unspecified convulsions: Secondary | ICD-10-CM

## 2014-02-04 DIAGNOSIS — G510 Bell's palsy: Secondary | ICD-10-CM

## 2014-02-04 DIAGNOSIS — F445 Conversion disorder with seizures or convulsions: Secondary | ICD-10-CM

## 2014-02-04 DIAGNOSIS — R202 Paresthesia of skin: Secondary | ICD-10-CM

## 2014-02-04 DIAGNOSIS — R209 Unspecified disturbances of skin sensation: Secondary | ICD-10-CM | POA: Insufficient documentation

## 2014-02-04 DIAGNOSIS — Z791 Long term (current) use of non-steroidal anti-inflammatories (NSAID): Secondary | ICD-10-CM | POA: Insufficient documentation

## 2014-02-04 DIAGNOSIS — G8929 Other chronic pain: Secondary | ICD-10-CM | POA: Insufficient documentation

## 2014-02-04 DIAGNOSIS — R200 Anesthesia of skin: Secondary | ICD-10-CM

## 2014-02-04 HISTORY — DX: Other chronic pain: G89.29

## 2014-02-04 HISTORY — DX: Unspecified convulsions: R56.9

## 2014-02-04 HISTORY — DX: Chest pain, unspecified: R07.9

## 2014-02-04 HISTORY — DX: Conversion disorder with seizures or convulsions: F44.5

## 2014-02-04 LAB — URINALYSIS, ROUTINE W REFLEX MICROSCOPIC
BILIRUBIN URINE: NEGATIVE
Glucose, UA: NEGATIVE mg/dL
HGB URINE DIPSTICK: NEGATIVE
KETONES UR: NEGATIVE mg/dL
Leukocytes, UA: NEGATIVE
Nitrite: NEGATIVE
PROTEIN: NEGATIVE mg/dL
Specific Gravity, Urine: 1.025 (ref 1.005–1.030)
UROBILINOGEN UA: 1 mg/dL (ref 0.0–1.0)
pH: 7.5 (ref 5.0–8.0)

## 2014-02-04 LAB — COMPREHENSIVE METABOLIC PANEL
ALBUMIN: 3.5 g/dL (ref 3.5–5.2)
ALT: 12 U/L (ref 0–35)
AST: 20 U/L (ref 0–37)
Alkaline Phosphatase: 81 U/L (ref 39–117)
Anion gap: 14 (ref 5–15)
BUN: 10 mg/dL (ref 6–23)
CHLORIDE: 102 meq/L (ref 96–112)
CO2: 23 mEq/L (ref 19–32)
CREATININE: 0.68 mg/dL (ref 0.50–1.10)
Calcium: 9 mg/dL (ref 8.4–10.5)
GFR calc Af Amer: >90 mL/min (ref >90)
GFR calc non Af Amer: >90 mL/min (ref >90)
Glucose, Bld: 92 mg/dL (ref 70–99)
Potassium: 4 mEq/L (ref 3.7–5.3)
Sodium: 139 mEq/L (ref 137–147)
TOTAL PROTEIN: 7.5 g/dL (ref 6.0–8.3)

## 2014-02-04 LAB — I-STAT CHEM 8, ED
BUN: 8 mg/dL (ref 6–23)
Calcium, Ion: 1.15 mmol/L (ref 1.12–1.23)
Chloride: 106 mEq/L (ref 96–112)
Creatinine, Ser: 0.7 mg/dL (ref 0.50–1.10)
Glucose, Bld: 95 mg/dL (ref 70–99)
HCT: 36 % (ref 36.0–46.0)
Hemoglobin: 12.2 g/dL (ref 12.0–15.0)
Potassium: 3.9 mEq/L (ref 3.7–5.3)
SODIUM: 140 meq/L (ref 137–147)
TCO2: 22 mmol/L (ref 0–100)

## 2014-02-04 LAB — CBC
HCT: 33.5 % — ABNORMAL LOW (ref 36.0–46.0)
Hemoglobin: 10.9 g/dL — ABNORMAL LOW (ref 12.0–15.0)
MCH: 27.9 pg (ref 26.0–34.0)
MCHC: 32.5 g/dL (ref 30.0–36.0)
MCV: 85.9 fL (ref 78.0–100.0)
Platelets: 234 10*3/uL (ref 150–400)
RBC: 3.9 MIL/uL (ref 3.87–5.11)
RDW: 13.9 % (ref 11.5–15.5)
WBC: 6.4 10*3/uL (ref 4.0–10.5)

## 2014-02-04 LAB — DIFFERENTIAL
BASOS ABS: 0 10*3/uL (ref 0.0–0.1)
Basophils Relative: 0 % (ref 0–1)
Eosinophils Absolute: 0.1 10*3/uL (ref 0.0–0.7)
Eosinophils Relative: 2 % (ref 0–5)
Lymphocytes Relative: 40 % (ref 12–46)
Lymphs Abs: 2.6 10*3/uL (ref 0.7–4.0)
Monocytes Absolute: 0.8 10*3/uL (ref 0.1–1.0)
Monocytes Relative: 12 % (ref 3–12)
NEUTROS ABS: 2.9 10*3/uL (ref 1.7–7.7)
NEUTROS PCT: 46 % (ref 43–77)

## 2014-02-04 LAB — RAPID URINE DRUG SCREEN, HOSP PERFORMED
AMPHETAMINES: NOT DETECTED
BENZODIAZEPINES: NOT DETECTED
Barbiturates: NOT DETECTED
Cocaine: NOT DETECTED
OPIATES: NOT DETECTED
TETRAHYDROCANNABINOL: NOT DETECTED

## 2014-02-04 LAB — CBG MONITORING, ED: GLUCOSE-CAPILLARY: 82 mg/dL (ref 70–99)

## 2014-02-04 LAB — I-STAT TROPONIN, ED: TROPONIN I, POC: 0.01 ng/mL (ref 0.00–0.08)

## 2014-02-04 LAB — PROTIME-INR
INR: 1 (ref 0.00–1.49)
Prothrombin Time: 13.2 seconds (ref 11.6–15.2)

## 2014-02-04 LAB — ETHANOL

## 2014-02-04 LAB — APTT: APTT: 29 s (ref 24–37)

## 2014-02-04 NOTE — Consult Note (Signed)
Referring Physician: Rhunette Croft    Chief Complaint: Unresponsive with left sided weakness  HPI: Kaitlyn [name] is an 28 y.o. female with a history of pseudoseizures who by report of her daughter was talking normally with her.  She left the room and when she returned a few minutes later her mother was leaning to the left and was unresponsive.  EMS was called and on their arrival the patient was responsive but had left sided numbness and weakness.  Patient was brought in as a code stoke.  Initial NIHSS of 6.  On arrival left sided weakness was improved.   Last pseudoseizure was about 3 weeks ago.    Date last known well: Date: 02/04/2014 Time last known well: Time: 20:20 tPA Given: No: Not felt to be a stroke  Past medical history: Back pain, pseudoseizures  Past Surgical History  Procedure Laterality Date  . Cesarean section classical      Family history: Mother with diabetes, father decreased from suicide . Social History:  Patient reports no history of alcohol, tobacco or illicit drug abuse.  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  Allergies: Iodine contrast dye  Medications: I have reviewed the patient's current medications. Meloxicam  ROS: History obtained from the patient  General ROS: negative for - chills, fatigue, fever, night sweats, weight gain or weight loss Psychological ROS: negative for - behavioral disorder, hallucinations, memory difficulties, mood swings or suicidal ideation Ophthalmic ROS: negative for - blurry vision, double vision, eye pain or loss of vision ENT ROS: negative for - epistaxis, nasal discharge, oral lesions, sore throat, tinnitus or vertigo Allergy and Immunology ROS: negative for - hives or itchy/watery eyes Hematological and Lymphatic ROS: negative for - bleeding problems, bruising or swollen lymph nodes Endocrine ROS: negative for - galactorrhea, hair pattern changes, polydipsia/polyuria or temperature  intolerance Respiratory ROS: negative for - cough, hemoptysis, shortness of breath or wheezing Cardiovascular ROS: negative for - chest pain, dyspnea on exertion, edema or irregular heartbeat Gastrointestinal ROS: negative for - abdominal pain, diarrhea, hematemesis, nausea/vomiting or stool incontinence Genito-Urinary ROS: negative for - dysuria, hematuria, incontinence or urinary frequency/urgency Musculoskeletal ROS: back pain Neurological ROS: as noted in HPI Dermatological ROS: negative for rash and skin lesion changes  Physical Examination: Blood pressure 138/73, pulse 98, temperature 98.1 F (36.7 C), temperature source Oral, resp. rate 17, height 4\' 9"  (1.448 m), weight 95.255 kg (210 lb), last menstrual period 01/18/2014, SpO2 100.00%.  Neurologic Examination: Mental Status: Alert, oriented, thought content appropriate.  Speech fluent without evidence of aphasia but some mild dysarthria.  Able to follow 3 step commands without difficulty. Left neglect Cranial Nerves: II: Discs flat bilaterally; Visual fields grossly normal, pupils equal, round, reactive to light and accommodation III,IV, VI: ptosis not present, extra-ocular motions intact bilaterally V,VII: With speech patient contracts the left side of her face only but she smile symmetrically, facial light touch sensation absent on the left splitting the midline VIII: hearing normal bilaterally IX,X: gag reflex present XI: bilateral shoulder shrug XII: midline tongue extension Motor: Right : Upper extremity   5/5    Left:     Upper extremity   5/5  Lower extremity   5/5     Lower extremity   5/5 Tone and bulk:normal tone throughout; no atrophy noted Sensory: Pinprick and light touch decreased on the left splitting the midline on the trunk Deep Tendon Reflexes: 2+ and symmetric throughout Plantars: Right: downgoing   Left: downgoing Cerebellar: normal  finger-to-nose and normal heel-to-shin test Gait: Unable to test CV:  pulses palpable throughout     Laboratory Studies:  Basic Metabolic Panel:  Recent Labs Lab 02/04/14 2053  NA 140  K 3.9  CL 106  GLUCOSE 95  BUN 8  CREATININE 0.70    Liver Function Tests: No results found for this basename: AST, ALT, ALKPHOS, BILITOT, PROT, ALBUMIN,  in the last 168 hours No results found for this basename: LIPASE, AMYLASE,  in the last 168 hours No results found for this basename: AMMONIA,  in the last 168 hours  CBC:  Recent Labs Lab 02/04/14 2046 02/04/14 2053  WBC 6.4  --   NEUTROABS 2.9  --   HGB 10.9* 12.2  HCT 33.5* 36.0  MCV 85.9  --   PLT 234  --     Cardiac Enzymes: No results found for this basename: CKTOTAL, CKMB, CKMBINDEX, TROPONINI,  in the last 168 hours  BNP: No components found with this basename: POCBNP,   CBG: No results found for this basename: GLUCAP,  in the last 168 hours  Microbiology: No results found for this or any previous visit.  Coagulation Studies:  Recent Labs  02/04/14 2046  LABPROT 13.2  INR 1.00    Urinalysis: No results found for this basename: COLORURINE, APPERANCEUR, LABSPEC, PHURINE, GLUCOSEU, HGBUR, BILIRUBINUR, KETONESUR, PROTEINUR, UROBILINOGEN, NITRITE, LEUKOCYTESUR,  in the last 168 hours  Lipid Panel: No results found for this basename: chol,  trig,  hdl,  cholhdl,  vldl,  ldlcalc    HgbA1C:  No results found for this basename: HGBA1C    Urine Drug Screen:   No results found for this basename: labopia,  cocainscrnur,  labbenz,  amphetmu,  thcu,  labbarb    Alcohol Level: No results found for this basename: ETH,  in the last 168 hours  Other results: EKG: sinus rhythm at 94 bpm.  Imaging: Ct Head Wo Contrast  02/04/2014   CLINICAL DATA:  Left-sided weakness.  Code stroke  EXAM: CT HEAD WITHOUT CONTRAST  TECHNIQUE: Contiguous axial images were obtained from the base of the skull through the vertex without intravenous contrast.  COMPARISON:  None.  FINDINGS: Ventricle size is  normal. Negative for acute or chronic infarction. Negative for hemorrhage or fluid collection. Negative for mass or edema. No shift of the midline structures.  Calvarium is intact.  IMPRESSION: Normal  Critical Value/emergent results were called by telephone at the time of interpretation on 02/04/2014 at 9:08 pm to Dr. Thad Rangereynolds, who verbally acknowledged these results.   Electronically Signed   By: Marlan Palauharles  Clark M.D.   On: 02/04/2014 21:10    Assessment: 28 y.o. female presenting after an episode of unresponsiveness with a left hemiparesis noted afterwards.  Patient's symptoms are slowly improving with much of her findings on neurological examination at this time being functional.  Head CT reviewed and is unremarkable.  Do not suspect an acute ischemic insult at this time.  Patient without risk factors.  Pseudoseizure is more likely.  Code stroke to be cancelled.    Stroke Risk Factors - none  Plan: 1.  No further neurologic intervention is recommended at this time.  If further questions arise, please call or page at that time.  Thank you for allowing neurology to participate in the care of this patient.  Patient to seek outpatient care for her pseudoseizures.    Case discussed with Dr. Carver FilaNanavati  Jex Strausbaugh, MD [facility] [phone] 02/04/2014, 9:22 PM

## 2014-02-04 NOTE — ED Notes (Signed)
During patients swallow screen she no longer had her facial droop and was able to open her mouth without any difficulty or paralysis. After the screening was complete the patient began to displace facial drooping that would intermittently travel from each side of her face while she was talking to me.

## 2014-02-04 NOTE — ED Notes (Signed)
Code stroke cancelled at 2112 per Dr. Thad Rangereynolds.

## 2014-02-04 NOTE — ED Notes (Signed)
Patient arrived via [facility] from home with stroke like symptoms. Patients daughter age 89 saw mother slump to her left and developed slurred speech and left sided weakness and left facial droop. EMS states her symptoms improved when she arrived to ER. Patient continued to have left sided facial droop with left sided weakness. EMS BP 240/112, HR 116, Resp 16, Spo2 100 RA.
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
## 2014-02-05 ENCOUNTER — Encounter (HOSPITAL_COMMUNITY): Payer: Self-pay | Admitting: Emergency Medicine

## 2014-02-05 ENCOUNTER — Telehealth: Payer: Self-pay | Admitting: Internal Medicine

## 2014-02-05 ENCOUNTER — Ambulatory Visit (HOSPITAL_BASED_OUTPATIENT_CLINIC_OR_DEPARTMENT_OTHER): Payer: No Typology Code available for payment source | Attending: Internal Medicine | Admitting: Radiology

## 2014-02-05 VITALS — Ht <= 58 in | Wt 226.0 lb

## 2014-02-05 DIAGNOSIS — G478 Other sleep disorders: Secondary | ICD-10-CM | POA: Insufficient documentation

## 2014-02-05 DIAGNOSIS — R0989 Other specified symptoms and signs involving the circulatory and respiratory systems: Secondary | ICD-10-CM | POA: Insufficient documentation

## 2014-02-05 DIAGNOSIS — G4730 Sleep apnea, unspecified: Secondary | ICD-10-CM

## 2014-02-05 DIAGNOSIS — R0609 Other forms of dyspnea: Secondary | ICD-10-CM | POA: Insufficient documentation

## 2014-02-05 NOTE — ED Notes (Signed)
Patient discharged with all personal belongings. 

## 2014-02-05 NOTE — Telephone Encounter (Signed)
Pt. States that [facility] has faxed over several times request for a back brace....Marland Kitchen.Marland Kitchen.please call patient

## 2014-02-05 NOTE — Telephone Encounter (Signed)
Received a call from [facility]say [facility]. Lillia AbedLindsay states that they did not fax over any forms for a back brace for the patient. She states the patient came in yesterday asking for a note for being out of work and they gave that to the patient.

## 2014-02-05 NOTE — ED Provider Notes (Signed)
CSN: [id number]     Arrival date & time 02/04/14  2044 History   First MD Initiated Contact with Patient 02/04/14 2114     Chief Complaint  Patient presents with  . Code Stroke    An emergency department physician performed an initial assessment on this suspected stroke patient at 2045. (Consider location/radiation/quality/duration/timing/severity/associated sxs/prior Treatment) HPI Comments: 28 y.o. Female comes in with cc of unresponsive, left sided numbness and facial droop. Pt has history of pseudoseizures and by report of the daughter to EMS, was talking normally with but a few minutes later was found leaning to the left and was unresponsive.   EMS was called and on their arrival the patient was responsive but had left sided numbness and weakness.  Patient was brought in as a code stoke. On arrival left sided weakness was improved.    Last pseudoseizure was about 3 weeks ago.       The history is provided by the patient.    Past Medical History  Diagnosis Date  . Pseudoseizures   . Chronic chest pain    Past Surgical History  Procedure Laterality Date  . Cesarean section classical     History reviewed. No pertinent family history. History  Substance Use Topics  . Smoking status: Former Games developermoker  . Smokeless tobacco: Not on file  . Alcohol Use: No   OB History   Grav Para Term Preterm Abortions TAB SAB Ect Mult Living                 Review of Systems  Constitutional: Positive for activity change.  Respiratory: Negative for shortness of breath.   Cardiovascular: Negative for chest pain.  Gastrointestinal: Negative for nausea, vomiting and abdominal pain.  Genitourinary: Negative for dysuria.  Musculoskeletal: Negative for neck pain.  Neurological: Positive for speech difficulty, weakness and numbness. Negative for headaches.      Allergies  Ivp dye and Lamictal  Home Medications   Prior to Admission medications   Medication Sig Start Date End Date Taking?  Authorizing Provider  cyclobenzaprine (FLEXERIL) 10 MG tablet Take 10 mg by mouth 3 (three) times daily as needed for muscle spasms.   Yes Historical Provider, MD  meloxicam (MOBIC) 15 MG tablet Take 15 mg by mouth daily.   Yes Historical Provider, MD   BP 128/88  Pulse 73  Temp(Src) 98.5 F (36.9 C) (Oral)  Resp 14  Ht 4\' 9"  (1.448 m)  Wt 210 lb (95.255 kg)  BMI 45.43 kg/m2  SpO2 99%  LMP 01/18/2014 Physical Exam  Nursing note and vitals reviewed. Constitutional: [name], place, and time. She appears well-developed and well-nourished.  HENT:  Head: Normocephalic and atraumatic.  Eyes: EOM are normal. Pupils are equal, round, and reactive to light.  Neck: Neck supple.  Cardiovascular: Normal rate, regular rhythm and normal heart sounds.   No murmur heard. Pulmonary/Chest: Effort normal. No respiratory distress.  Abdominal: Soft. She exhibits no distension. There is no tenderness. There is no rebound and no guarding.  Neurological: She is alert and oriented to person, place, and time.  Right sided facial droop - which resolved when patient asked to move the right side of the face. Upper and lower extremity strength 4+/5. Subjective numbness on the LUE.  Skin: Skin is warm and dry.    ED Course  Procedures (including critical care time) Labs Review Labs Reviewed  CBC - Abnormal; Notable for the following:    Hemoglobin 10.9 (*)  HCT 33.5 (*)    All other components within normal limits  COMPREHENSIVE METABOLIC PANEL - Abnormal; Notable for the following:    Total Bilirubin <0.2 (*)    All other components within normal limits  ETHANOL  PROTIME-INR  APTT  DIFFERENTIAL  URINE RAPID DRUG SCREEN (HOSP PERFORMED)  URINALYSIS, ROUTINE W REFLEX MICROSCOPIC  I-STAT CHEM 8, ED  I-STAT TROPOININ, ED  I-STAT TROPOININ, ED  CBG MONITORING, ED    Imaging Review Ct Head Wo Contrast  02/04/2014   CLINICAL DATA:  Left-sided weakness.  Code stroke  EXAM: CT  HEAD WITHOUT CONTRAST  TECHNIQUE: Contiguous axial images were obtained from the base of the skull through the vertex without intravenous contrast.  COMPARISON:  None.  FINDINGS: Ventricle size is normal. Negative for acute or chronic infarction. Negative for hemorrhage or fluid collection. Negative for mass or edema. No shift of the midline structures.  Calvarium is intact.  IMPRESSION: Normal  Critical Value/emergent results were called by telephone at the time of interpretation on 02/04/2014 at 9:08 pm to Dr. Thad Ranger, who verbally acknowledged these results.   Electronically Signed   By: Marlan Palau M.D.   On: 02/04/2014 21:10     EKG Interpretation   Date/Time:  Tuesday February 04 2014 21:02:55 EDT Ventricular Rate:  94 PR Interval:  175 QRS Duration: 87 QT Interval:  356 QTC Calculation: 445 R Axis:   81 Text Interpretation:  Age not entered, assumed to be  28 years old for  purpose of ECG interpretation Sinus rhythm Biatrial enlargement Confirmed  by Rhunette Croft, MD, Janey Genta [phone]) on 02/05/2014 12:05:27 AM      MDM   Final diagnoses:  Left facial numbness  Numbness and tingling in left arm  Facial palsy    Pt comes in with left sided numbness and right sided facial weakness. Hx of pseudoseizure, and was found unresponsive by daughter. Stroke considered unlikely, given right facial weakness and left sided numbness, with the weakness resolving. Neuro cancelled the stroke pager.  Pt advised that there is nothing to worry about. Her exam has improved on the LUE, subjective weakness and numbness still present. Labs are reassuring. Will d.c.  Derwood Kaplan, MD 02/05/14 [phone]

## 2014-02-05 NOTE — Telephone Encounter (Signed)
Returned patients call. Patient states that the paper work was faxed over to [facility] on 12/20/2013 per [facility]. Informed patient that I will call [facility] to f/u on this matter. Patient verbalized agreement. Patient then proceeded to inform she was seen in the ED on 02/04/2014. Stroke was ruled out and she was diagnosed with psuedoseizures. Patient states she is still having some left facial numbness and slurred speech. Patient has an appointment on 02/07/2014 at 5:15 PM with Cletus GashV. Keck, NP. Informed patient I will speak with NP to schedule her appointment for tomorrow morning. Patient verbalized agreement. Patient denies any dizziness, blurred vision, and headaches.   Spoke with L. Ducatte, RN and informed her of information to see if patient can have an appointment for Cletus GashV. Keck, NP tomorrow morning at 10:45 AM or 11:15 AM. Was informed she will speak with Cletus GashV. Keck, NP and get back with me or call patient herself.

## 2014-02-05 NOTE — Discharge Instructions (Signed)

## 2014-02-06 ENCOUNTER — Ambulatory Visit: Payer: No Typology Code available for payment source | Attending: Internal Medicine | Admitting: Internal Medicine

## 2014-02-06 ENCOUNTER — Encounter: Payer: Self-pay | Admitting: Internal Medicine

## 2014-02-06 VITALS — BP 125/85 | HR 68 | Temp 98.9°F | Resp 14 | Ht <= 58 in | Wt 226.0 lb

## 2014-02-06 DIAGNOSIS — R569 Unspecified convulsions: Secondary | ICD-10-CM | POA: Insufficient documentation

## 2014-02-06 DIAGNOSIS — Z79899 Other long term (current) drug therapy: Secondary | ICD-10-CM | POA: Insufficient documentation

## 2014-02-06 DIAGNOSIS — F319 Bipolar disorder, unspecified: Secondary | ICD-10-CM | POA: Insufficient documentation

## 2014-02-06 DIAGNOSIS — Z87891 Personal history of nicotine dependence: Secondary | ICD-10-CM | POA: Insufficient documentation

## 2014-02-06 DIAGNOSIS — F445 Conversion disorder with seizures or convulsions: Secondary | ICD-10-CM | POA: Insufficient documentation

## 2014-02-06 DIAGNOSIS — F449 Dissociative and conversion disorder, unspecified: Secondary | ICD-10-CM

## 2014-02-06 DIAGNOSIS — M546 Pain in thoracic spine: Secondary | ICD-10-CM | POA: Insufficient documentation

## 2014-02-06 NOTE — Patient Instructions (Signed)
Back Pain, Adult Low back pain is very common. About 1 in 5 people have back pain.The cause of low back pain is rarely dangerous. The pain often gets better over time.About half of people with a sudden onset of back pain feel better in just 2 weeks. About 8 in 10 people feel better by 6 weeks.  CAUSES Some common causes of back pain include:  Strain of the muscles or ligaments supporting the spine.  Wear and tear (degeneration) of the spinal discs.  Arthritis.  Direct injury to the back. DIAGNOSIS Most of the time, the direct cause of low back pain is not known.However, back pain can be treated effectively even when the exact cause of the pain is unknown.Answering your caregiver's questions about your overall health and symptoms is one of the most accurate ways to make sure the cause of your pain is not dangerous. If your caregiver needs more information, he or she may order lab work or imaging tests (X-rays or MRIs).However, even if imaging tests show changes in your back, this usually does not require surgery. HOME CARE INSTRUCTIONS For many people, back pain returns.Since low back pain is rarely dangerous, it is often a condition that people can learn to manageon their own.   Remain active. It is stressful on the back to sit or stand in one place. Do not sit, drive, or stand in one place for more than 30 minutes at a time. Take short walks on level surfaces as soon as pain allows.Try to increase the length of time you walk each day.  Do not stay in bed.Resting more than 1 or 2 days can delay your recovery.  Do not avoid exercise or work.Your body is made to move.It is not dangerous to be active, even though your back may hurt.Your back will likely heal faster if you return to being active before your pain is gone.  Pay attention to your body when you bend and lift. Many people have less discomfortwhen lifting if they bend their knees, keep the load close to their bodies,and  avoid twisting. Often, the most comfortable positions are those that put less stress on your recovering back.  Find a comfortable position to sleep. Use a firm mattress and lie on your side with your knees slightly bent. If you lie on your back, put a pillow under your knees.  Only take over-the-counter or prescription medicines as directed by your caregiver. Over-the-counter medicines to reduce pain and inflammation are often the most helpful.Your caregiver may prescribe muscle relaxant drugs.These medicines help dull your pain so you can more quickly return to your normal activities and healthy exercise.  Put ice on the injured area.  Put ice in a plastic bag.  Place a towel between your skin and the bag.  Leave the ice on for 15-20 minutes, 03-04 times a day for the first 2 to 3 days. After that, ice and heat may be alternated to reduce pain and spasms.  Ask your caregiver about trying back exercises and gentle massage. This may be of some benefit.  Avoid feeling anxious or stressed.Stress increases muscle tension and can worsen back pain.It is important to recognize when you are anxious or stressed and learn ways to manage it.Exercise is a great option. SEEK MEDICAL CARE IF:  You have pain that is not relieved with rest or medicine.  You have pain that does not improve in 1 week.  You have new symptoms.  You are generally not feeling well. SEEK   IMMEDIATE MEDICAL CARE IF:   You have pain that radiates from your back into your legs.  You develop new bowel or bladder control problems.  You have unusual weakness or numbness in your arms or legs.  You develop nausea or vomiting.  You develop abdominal pain.  You feel faint. Document Released: 07/04/2005 Document Revised: 01/03/2012 Document Reviewed: 11/05/2013 ExitCare Patient Information 2015 [employer]. This information is not intended to replace advice given to you by your health care provider. Make sure you  discuss any questions you have with your health care provider.  

## 2014-02-06 NOTE — Progress Notes (Signed)
Patient ID: Kaitlyn [name], female   DOB: 08/09/1985, 28 y.o.   MRN: [id number]   Autym Siess, is a 28 y.o. female  [id number]  [id number]  DOB - 04-19-1986  Chief Complaint  Patient presents with  . Hospitalization Follow-up        Subjective:   Kaitlyn [name] is a 28 y.o. female here today for a follow up visit. Patient has history of bipolar disorder, sleep disorder, secondary infertility recently seen in the ER for possible is pseudoseizure the second episode in 3 weeks. She had CT scan of the head on both occasions, no acute findings. She is here today as a hospital followup. She claims to have residual facial droop on the left as a result of her seizure. She is a diagnosis of pseudoseizure, she has seen a neurologist in the past, she has had extensive workup for seizure which was negative. She is known to have bipolar disorder, she recently went through a divorce about 5 months ago, although she claims that she is doing very well before after the divorce, there is possibility that the recent episodes of seizure related to social and psychological stress. She would like to see a Child psychotherapist but not today. She had seen and was followed up with psychiatrist in the past, as of today she denies any depression, suicidal ideation or thoughts. She has a good job as a Production designer, theatre/television/film at [employer], she is also a Naval architect early childcare.  She is a former smoker, she does not drink alcohol. Today she has no complaint except for the "occasional facial droop on the left, which comes and goes". She also has a chronic back pain and she is requesting a back brace but she will need a form to be filled for [facility]. She has been seen for this complaint and prescribed meloxicam and cyclobenzaprine which is working appropriately. Pain is on and off. She just came from a sleep study to investigate her sleep disorder, result is being awaited. Patient has No headache, No chest pain, No abdominal  pain - No Nausea, No new weakness tingling or numbness, No Cough - SOB.  Problem  Midline Thoracic Back Pain  Psychiatric Pseudoseizure    ALLERGIES: Allergies  Allergen Reactions  . Contrast Media [Iodinated Diagnostic Agents] Hives and Itching  . Iohexol      Code: HIVES, Desc: nausea/vomiting-itching/hives/rash after mri contrast multihance-benedryl was given by radiologist-then pt had seizure-pt was monitored and released to family's care, Onset Date: [id number]   . Ivp Dye [Iodinated Diagnostic Agents] Hives and Itching  . Lamotrigine Hives  . Lamictal [Lamotrigine] Rash    PAST MEDICAL HISTORY: Past Medical History  Diagnosis Date  . Bipolar disorder   . Anemia   . Endometriosis     ENDOMETRIOSIS???  . Pseudoseizures   . Chronic chest pain     MEDICATIONS AT HOME: Prior to Admission medications   Medication Sig Start Date End Date Taking? Authorizing Provider  cyclobenzaprine (FLEXERIL) 10 MG tablet Take 10 mg by mouth 3 (three) times daily as needed for muscle spasms.   Yes Historical Provider, MD  meloxicam (MOBIC) 15 MG tablet Take 15 mg by mouth daily.   Yes Historical Provider, MD  cyclobenzaprine (FLEXERIL) 10 MG tablet Take 10 mg by mouth 3 (three) times daily as needed for muscle spasms.    Historical Provider, MD  meloxicam (MOBIC) 15 MG tablet Take 15 mg by mouth daily.    Historical Provider, MD  metroNIDAZOLE (FLAGYL) 500  MG tablet Take 500 mg by mouth 2 (two) times daily.    Historical Provider, MD     Objective:   Filed Vitals:   02/06/14 0911  BP: 125/85  Pulse: 68  Temp: 98.9 F (37.2 C)  TempSrc: Oral  Resp: 14  Height: 4\' 9"  (1.448 m)  Weight: 226 lb (102.513 kg)  SpO2: 99%    Exam General appearance : Awake, alert, not in any distress. Speech Clear. Not toxic looking, morbidly obese HEENT: Atraumatic and Normocephalic, pupils equally reactive to light and accomodation Neck: supple, no JVD. No cervical lymphadenopathy.  Chest:Good air  entry bilaterally, no added sounds  CVS: S1 S2 regular, no murmurs.  Abdomen: Bowel sounds present, Non tender and not distended with no gaurding, rigidity or rebound. Extremities: B/L Lower Ext shows no edema, both legs are warm to touch Neurology: Awake alert, and oriented X 3, CN II-XII intact, "left facial droop" subjectively but objectively there is no evidence of facial nerve palsy  Data Review Lab Results  Component Value Date   HGBA1C 5.2 12/19/2013     Assessment & Plan   1. Midline thoracic back pain Continue meloxicam and Flexeril Patient was counseled extensively about nutrition and exercise Patient was encouraged to shed some weight which may help in most of her comorbidities  2. Psychiatric pseudoseizure  - Ambulatory referral to Neurology/psychiatrist  Patient was extensively counseled about her diagnosis, she was given the option of seeing a psychiatrist but she declined, she was also given an option of seeing a Child psychotherapistsocial worker here in the clinic today but she would prefer to come back for that since she is just coming out of sleep study and so tired.  Return in about 6 months (around 08/09/2014), or if symptoms worsen or fail to improve, for Pseudoseizure. See [place] in 2 weeks for Counseling.  The patient was given clear instructions to go to ER or return to medical center if symptoms don't improve, worsen or new problems develop. The patient verbalized understanding. The patient was told to call to get lab results if they haven't heard anything in the next week.   This note has been created with Education officer, environmentalDragon speech recognition software and smart phrase technology. Any transcriptional errors are unintentional.    Jeanann LewandowskyJEGEDE, Barbar Brede, MD, MHA, FACP, FAAP [facility] [place], [place] [phone]   02/06/2014, 9:50 AM

## 2014-02-06 NOTE — Progress Notes (Signed)
HFU Pt is here following up after going to the ED with seizures. Pt states that sh is still feeling very tierd. Pt reports having chronic back pain.

## 2014-02-07 ENCOUNTER — Ambulatory Visit: Payer: Self-pay | Admitting: Internal Medicine

## 2014-02-08 DIAGNOSIS — G4730 Sleep apnea, unspecified: Secondary | ICD-10-CM

## 2014-02-08 NOTE — Sleep Study (Signed)
   NAME: Kaitlyn [name] DATE OF BIRTH:  03/23/1986 MEDICAL RECORD NUMBER [id number]  LOCATION: [facility]  PHYSICIAN: YOUNG,CLINTON D  DATE OF STUDY: 02/05/2014  SLEEP STUDY TYPE: Nocturnal Polysomnogram               REFERRING PHYSICIAN: Ambrose [name], Kaitlyn A, NP  INDICATION FOR STUDY: Hypersomnia with sleep apnea  EPWORTH SLEEPINESS SCORE:   12/24 HEIGHT: 4\' 9"  (144.8 cm)  WEIGHT: 102.513 kg (226 lb)    Body mass index is 48.89 kg/(m^2).  NECK SIZE: 14 in.  MEDICATIONS: Charted for review  SLEEP ARCHITECTURE: Total sleep time 335.5 minutes with sleep efficiency 91.4%. Stage I was 6.1%, stage II 75.4%, stage III 0.4%, REM 18% of total sleep time. Sleep latency 22.5 minutes, REM latency 52.5 minutes, awake after sleep onset 9 minutes, arousal index was 6.8, bedtime medication: None  RESPIRATORY DATA: Apnea hypopneas index (AHI) 0.4 per hour. 2 total events scored, both as hypopneas while supine. REM AHI 2 per hour. CPAP titration was not done.  OXYGEN DATA: Mild to moderate snoring with oxygen desaturation to [name] nadir of 93% and mean saturation of 98.2% on room air.  CARDIAC DATA: Normal sinus rhythm  MOVEMENT/PARASOMNIA: No significant movement disturbance, no bathroom trips  IMPRESSION/ RECOMMENDATION:   1) Occasional respiratory event with sleep disturbance, within normal limits. AHI 0.4 per hour reflecting only 2 events. An AHI from 0-5 events per hour is considered normal for an adult. Mild to moderate snoring with oxygen desaturation to [name] nadir of 93% and mean saturation of 98.2% on room air for the study.   Signed Jetty Duhamellinton Young M.D. Waymon BudgeYOUNG,CLINTON D Diplomate, American Board of Sleep Medicine  ELECTRONICALLY SIGNED ON:  02/08/2014, 4:12 PM [facility] PH: [phone]   FX: [phone] ACCREDITED BY THE AMERICAN ACADEMY OF SLEEP MEDICINE

## 2014-02-11 ENCOUNTER — Ambulatory Visit: Payer: No Typology Code available for payment source | Attending: Internal Medicine | Admitting: *Deleted

## 2014-02-11 DIAGNOSIS — F32A Depression, unspecified: Secondary | ICD-10-CM

## 2014-02-11 DIAGNOSIS — F3289 Other specified depressive episodes: Secondary | ICD-10-CM

## 2014-02-11 DIAGNOSIS — F329 Major depressive disorder, single episode, unspecified: Secondary | ICD-10-CM

## 2014-02-11 NOTE — Progress Notes (Signed)
LCSW met with patient who shared much of her mood and emotional history.  Patient stated that she has dealt with trauma, depression, mania and anxiety in the past and currently is not taking any mental health medication for treatment.  LCSW began encouraging patient to work on self care and reflection.  Patient managing very difficult depressive symptoms that are making it very difficult for patient to focus outside of work.  Patient spends much of her time sleeping and not having energy to do much beyond the basic necessities of life.  LCSW completed PHQ 9 (15) and GAD 7(9). LCSW will follow within 1 week.  Christene Lye MSW, LCSW Duration 60 minutes.

## 2014-02-20 ENCOUNTER — Other Ambulatory Visit: Payer: Self-pay | Admitting: Internal Medicine

## 2014-02-20 ENCOUNTER — Ambulatory Visit: Payer: No Typology Code available for payment source | Attending: Internal Medicine | Admitting: *Deleted

## 2014-02-20 ENCOUNTER — Telehealth: Payer: Self-pay | Admitting: Internal Medicine

## 2014-02-20 DIAGNOSIS — F329 Major depressive disorder, single episode, unspecified: Secondary | ICD-10-CM

## 2014-02-20 DIAGNOSIS — F32A Depression, unspecified: Secondary | ICD-10-CM

## 2014-02-20 DIAGNOSIS — F3289 Other specified depressive episodes: Secondary | ICD-10-CM

## 2014-02-20 MED ORDER — ESCITALOPRAM OXALATE 10 MG PO TABS: 10.0000 mg | ORAL_TABLET | Freq: Every day | ORAL | Status: DC

## 2014-02-20 NOTE — Telephone Encounter (Signed)
Pt. Has been waiting on referral for back brace, [facility] has sent records and have been reviewed. Pt. Would like to know if referral has been sent....please call patient..Marland Kitchen

## 2014-02-21 NOTE — Progress Notes (Signed)
LCSW met with patient who shared that she had some anxiety about a relationship. LCSW encouraged patient to be open in communication. But patient identified that her biggest focus now is to maintain balance.  LCSW encouraged patient to set and identify her true priorities,create a schedule that reflects those priorities and practice staying committed to the schedule and priorities.  LCSW will continue to follow for additional emotional support as needed.   LCSW also contacted provider about support with mild anti depressant.   Christene Lye MSW, LCSW Duration 45 minutes

## 2014-02-27 ENCOUNTER — Other Ambulatory Visit: Payer: Self-pay

## 2014-03-04 ENCOUNTER — Telehealth: Payer: Self-pay | Admitting: *Deleted

## 2014-03-04 NOTE — Telephone Encounter (Signed)
Left message on VM to return my call. 

## 2014-03-10 ENCOUNTER — Other Ambulatory Visit: Payer: Self-pay

## 2014-03-25 ENCOUNTER — Ambulatory Visit: Payer: Self-pay

## 2014-04-30 ENCOUNTER — Telehealth: Payer: Self-pay | Admitting: Internal Medicine

## 2014-04-30 NOTE — Telephone Encounter (Signed)
Pt. Calling to request results of sleep study she had. Please f/u with pt.

## 2014-05-01 ENCOUNTER — Telehealth: Payer: Self-pay | Admitting: Emergency Medicine

## 2014-05-01 NOTE — Telephone Encounter (Signed)
Left VM with pt to call [facility] for sleep study results or call nurse line for additional questions

## 2014-05-08 ENCOUNTER — Encounter: Payer: Self-pay | Admitting: Internal Medicine

## 2014-05-08 ENCOUNTER — Ambulatory Visit: Payer: No Typology Code available for payment source | Attending: Internal Medicine | Admitting: Internal Medicine

## 2014-05-08 VITALS — BP 132/83 | HR 91 | Temp 97.8°F | Resp 16 | Ht 60.0 in | Wt 228.0 lb

## 2014-05-08 DIAGNOSIS — Z87891 Personal history of nicotine dependence: Secondary | ICD-10-CM | POA: Insufficient documentation

## 2014-05-08 DIAGNOSIS — R5383 Other fatigue: Secondary | ICD-10-CM | POA: Insufficient documentation

## 2014-05-08 DIAGNOSIS — N926 Irregular menstruation, unspecified: Secondary | ICD-10-CM | POA: Insufficient documentation

## 2014-05-08 NOTE — Progress Notes (Signed)
Pt is here for a physical today. Pt is not due for a pap smear for another 3 years.

## 2014-05-08 NOTE — Progress Notes (Signed)
Patient ID: Kaitlyn [name], female   DOB: 11/25/1985, 28 y.o.   MRN: [id number]  CC: irregular menstruation/fertility  HPI:  Patient presents to clinic today for a follow up of sleep study results.  She reports that she was never given the results of the study.  She continues to report fatigue and daytime sleepiness.  Patient reports that she is also concerned about why her cycles have continued to remain irregular although she has been off Depo-Provera for a couple of years.  She does desire to conceive one day in the near future and states that she has not had any success as of yet.  She reports that she had a tubal flush in the past that was normal.    Allergies  Allergen Reactions  . Contrast Media [Iodinated Diagnostic Agents] Hives and Itching  . Iohexol      Code: HIVES, Desc: nausea/vomiting-itching/hives/rash after mri contrast multihance-benedryl was given by radiologist-then pt had seizure-pt was monitored and released to family's care, Onset Date: [id number]   . Ivp Dye [Iodinated Diagnostic Agents] Hives and Itching  . Lamotrigine Hives  . Lamictal [Lamotrigine] Rash   Past Medical History  Diagnosis Date  . Bipolar disorder   . Anemia   . Endometriosis     ENDOMETRIOSIS???  . Pseudoseizures   . Chronic chest pain    Current Outpatient Prescriptions on File Prior to Visit  Medication Sig Dispense Refill  . cyclobenzaprine (FLEXERIL) 10 MG tablet Take 10 mg by mouth 3 (three) times daily as needed for muscle spasms.      . cyclobenzaprine (FLEXERIL) 10 MG tablet Take 10 mg by mouth 3 (three) times daily as needed for muscle spasms.      . meloxicam (MOBIC) 15 MG tablet Take 15 mg by mouth daily.      . meloxicam (MOBIC) 15 MG tablet Take 15 mg by mouth daily.      Marland Kitchen. escitalopram (LEXAPRO) 10 MG tablet Take 1 tablet (10 mg total) by mouth daily.  30 tablet  1  . metroNIDAZOLE (FLAGYL) 500 MG tablet Take 500 mg by mouth 2 (two) times daily.       No current  facility-administered medications on file prior to visit.   Family History  Problem Relation Age of Onset  . Diabetes Mother   . Hypertension Mother   . Heart disease Mother    History   Social History  . Marital Status: Married    Spouse Name: N/A    Number of Children: N/A  . Years of Education: N/A   Occupational History  . Not on file.   Social History Main Topics  . Smoking status: Former Games developermoker  . Smokeless tobacco: Not on file  . Alcohol Use: No  . Drug Use: No  . Sexual Activity: Yes    Birth Control/ Protection: Condom, None   Other Topics Concern  . Not on file   Social History Narrative   ** Merged History Encounter **        Review of Systems: See HPI    Objective:   Filed Vitals:   05/08/14 1209  BP: 132/83  Pulse: 91  Temp: 97.8 F (36.6 C)  Resp: 16    Physical Exam  Cardiovascular: Normal rate, regular rhythm and normal heart sounds.   Pulmonary/Chest: Effort normal and breath sounds normal.  Abdominal: Soft. Bowel sounds are normal. She exhibits no distension.     Lab Results  Component Value Date   WBC 6.4 02/04/2014  HGB 12.2 02/04/2014   HCT 36.0 02/04/2014   MCV 85.9 02/04/2014   PLT 234 02/04/2014   Lab Results  Component Value Date   CREATININE 0.70 02/04/2014   BUN 8 02/04/2014   NA 140 02/04/2014   K 3.9 02/04/2014   CL 106 02/04/2014   CO2 23 02/04/2014    Lab Results  Component Value Date   HGBA1C 5.2 12/19/2013   Lipid Panel     Component Value Date/Time   CHOL 150 12/19/2013 1008   TRIG 42 12/19/2013 1008   HDL 40 12/19/2013 1008   CHOLHDL 3.8 12/19/2013 1008   VLDL 8 12/19/2013 1008   LDLCALC 102* 12/19/2013 1008       Assessment and plan:   Kaitlyn [name] was seen today for follow-up.  Diagnoses and associated orders for this visit:  Irregular menstrual cycle - Ambulatory referral to Gynecology   Return if symptoms worsen or fail to improve.        Holland CommonsKECK, Sheral Pfahler, NP-C [facility] [phone] 05/12/2014, 11:31 PM

## 2014-05-13 ENCOUNTER — Encounter: Payer: Self-pay | Admitting: Family Medicine

## 2014-05-19 ENCOUNTER — Encounter: Payer: Self-pay | Admitting: Internal Medicine

## 2014-05-20 ENCOUNTER — Emergency Department (INDEPENDENT_AMBULATORY_CARE_PROVIDER_SITE_OTHER)
Admission: EM | Admit: 2014-05-20 | Discharge: 2014-05-20 | Disposition: A | Discharge status: Home / Self Care | Payer: No Typology Code available for payment source | Source: Home / Self Care | Attending: Emergency Medicine | Admitting: Emergency Medicine

## 2014-05-20 ENCOUNTER — Encounter (HOSPITAL_COMMUNITY): Payer: Self-pay | Admitting: Emergency Medicine

## 2014-05-20 DIAGNOSIS — H109 Unspecified conjunctivitis: Secondary | ICD-10-CM

## 2014-05-20 MED ORDER — POLYMYXIN B-TRIMETHOPRIM 10000-0.1 UNIT/ML-% OP SOLN: 1.0000 [drp] | OPHTHALMIC | Status: DC

## 2014-05-20 NOTE — ED Notes (Signed)
Patient complains of left eye pain, onset Sunday-November 1 .  Has used "eye relief " eye drops

## 2014-05-20 NOTE — ED Provider Notes (Signed)
  Chief Complaint   No chief complaint on file.   History of Present Illness   Kaitlyn [name] is a 28 year old female who has had a three-day history of pain, itching, and burning of the left eye. It's been slightly red and has had yellowish crusting. Her vision has been a little bit blurry and she's had some photophobia. She denies any eye injury or foreign body in the eye. The right eye is normal. She denies any URI symptoms such as fever, chills, headache, nasal congestion, rhinorrhea, sore throat, adenopathy, or cough. She's had no sick exposures.  Review of Systems   Other than as noted above, the patient denies any of the following symptoms: Systemic:  No fever, chills, or headache. Eye:  No blurred vision, or diplopia. ENT:  No nasal congestion, rhinorrhea, or sore throat. Lymphatic:  No adenopathy. Skin:  No rash or pruritis.  PMFSH   Past medical history, family history, social history, meds, and allergies were reviewed.  She is allergic to Lamictal.  Physical Examination    Vital signs:  BP 128/68 mmHg  Pulse 86  Temp(Src) 98 F (36.7 C) (Oral)  Resp 12  SpO2 100%  LMP 05/07/2014  Visual Acuity:  Right Eye Distance: 20/20 Left Eye Distance: 20/30 Bilateral Distance: 20/20  General:  Alert and in no distress. Eye:  Her lids were normal. Conjunctiva was mildly injected. There was no discharge or crusting. Cornea was intact to floor seen staining. Anterior chamber was normal. PERRLA, full EOMs, fundi benign. ENT:  TMs and canals clear.  Nasal mucosa normal.  No intra-oral lesions, mucous membranes moist, pharynx clear. Neck:  No adenopathy tenderness or mass. Skin:  Clear, warm and dry.  Assessment   The encounter diagnosis was Conjunctivitis of left eye.  Plan     1.  Meds:  The following meds were prescribed:   Discharge Medication List as of 05/20/2014  7:48 PM    START taking these medications   Details  trimethoprim-polymyxin b (POLYTRIM) ophthalmic  solution Place 1 drop into the left eye every 4 (four) hours., Starting 05/20/2014, Until Discontinued, Normal        2.  Patient Education/Counseling:  The patient was given appropriate handouts, self care instructions, and instructed in symptomatic relief.    3.  Follow up:  The patient was told to follow up here if no better in 3 to 4 days, or sooner if becoming worse in any way, and given some red flag symptoms such as increasing pain or changes in vision which would prompt immediate return.  Follow up here as needed.      Reuben Likesavid C Nielle Duford, MD 05/20/14 2004

## 2014-05-20 NOTE — Discharge Instructions (Signed)

## 2014-05-28 ENCOUNTER — Encounter (HOSPITAL_BASED_OUTPATIENT_CLINIC_OR_DEPARTMENT_OTHER): Payer: Self-pay

## 2014-05-28 ENCOUNTER — Emergency Department (HOSPITAL_BASED_OUTPATIENT_CLINIC_OR_DEPARTMENT_OTHER)
Admission: EM | Admit: 2014-05-28 | Discharge: 2014-05-28 | Disposition: A | Discharge status: Home / Self Care | Payer: Self-pay | Attending: Emergency Medicine | Admitting: Emergency Medicine

## 2014-05-28 DIAGNOSIS — Y998 Other external cause status: Secondary | ICD-10-CM | POA: Insufficient documentation

## 2014-05-28 DIAGNOSIS — Z8742 Personal history of other diseases of the female genital tract: Secondary | ICD-10-CM | POA: Insufficient documentation

## 2014-05-28 DIAGNOSIS — Y9389 Activity, other specified: Secondary | ICD-10-CM | POA: Insufficient documentation

## 2014-05-28 DIAGNOSIS — Z87891 Personal history of nicotine dependence: Secondary | ICD-10-CM | POA: Insufficient documentation

## 2014-05-28 DIAGNOSIS — W57XXXA Bitten or stung by nonvenomous insect and other nonvenomous arthropods, initial encounter: Secondary | ICD-10-CM | POA: Insufficient documentation

## 2014-05-28 DIAGNOSIS — Z792 Long term (current) use of antibiotics: Secondary | ICD-10-CM | POA: Insufficient documentation

## 2014-05-28 DIAGNOSIS — Z79899 Other long term (current) drug therapy: Secondary | ICD-10-CM | POA: Insufficient documentation

## 2014-05-28 DIAGNOSIS — S70362A Insect bite (nonvenomous), left thigh, initial encounter: Secondary | ICD-10-CM | POA: Insufficient documentation

## 2014-05-28 DIAGNOSIS — F319 Bipolar disorder, unspecified: Secondary | ICD-10-CM | POA: Insufficient documentation

## 2014-05-28 DIAGNOSIS — G8929 Other chronic pain: Secondary | ICD-10-CM | POA: Insufficient documentation

## 2014-05-28 DIAGNOSIS — Y9289 Other specified places as the place of occurrence of the external cause: Secondary | ICD-10-CM | POA: Insufficient documentation

## 2014-05-28 MED ORDER — PREDNISONE 20 MG PO TABS: 40.0000 mg | ORAL_TABLET | Freq: Every day | ORAL | Status: DC

## 2014-05-28 MED ORDER — DIPHENHYDRAMINE HCL 25 MG PO TABS: 25.0000 mg | ORAL_TABLET | Freq: Four times a day (QID) | ORAL | Status: DC | PRN

## 2014-05-28 MED ORDER — PREDNISONE 50 MG PO TABS
60.0000 mg | ORAL_TABLET | Freq: Once | ORAL | Status: AC
  Administered 2014-05-28: 60 mg via ORAL
  Filled 2014-05-28 (×2): qty 1

## 2014-05-28 MED ORDER — HYDROCORTISONE 1 % EX OINT: 1.0000 "application " | TOPICAL_OINTMENT | Freq: Two times a day (BID) | CUTANEOUS | Status: DC

## 2014-05-28 NOTE — ED Notes (Signed)
Pt reports 2 "bites" to left thigh since yesterday. Itching and has now spread. Also sts painful when walking.

## 2014-05-28 NOTE — Discharge Instructions (Signed)

## 2014-05-28 NOTE — ED Provider Notes (Signed)
CSN: [id number]     Arrival date & time 05/28/14  1232 History   First MD Initiated Contact with Patient 05/28/14 1242     Chief Complaint  Patient presents with  . Insect Bite     (Consider location/radiation/quality/duration/timing/severity/associated sxs/prior Treatment) HPI   Kaitlyn [name] Is a 28 year old female who presents to emergency department with chief complaint of bug bite to the left thigh. Patient states that yesterday she had 2 small insect bites to the left thigh. They were about 1 cm in diameter at the time. Nonpainful, somewhat itchy. This morning she awoke to extremely large wheals on the left thigh one measuring approximately 10 cm across and the other about 7 cm across. She states that they're somewhat painful at this point. She denies any other lesions on her body. She denies any wheezing, chest pain, shortness of breath, oral swelling, change in voice, history of prior allergic reactions. She tried nothing at home to relieve his symptoms.  Past Medical History  Diagnosis Date  . Bipolar disorder   . Anemia   . Endometriosis     ENDOMETRIOSIS???  . Pseudoseizures   . Chronic chest pain    Past Surgical History  Procedure Laterality Date  . Cesarean section    . Pelvic laparoscopy    . Cesarean section classical     Family History  Problem Relation Age of Onset  . Diabetes Mother   . Hypertension Mother   . Heart disease Mother    History  Substance Use Topics  . Smoking status: Former Games developermoker  . Smokeless tobacco: Not on file  . Alcohol Use: No   OB History    Gravida Para Term Preterm AB TAB SAB Ectopic Multiple Living   1 1 1       1      Review of Systems Ten systems are reviewed and are negative for acute change except as noted in the HPI   Allergies  Contrast media; Iohexol; Ivp dye; Lamotrigine; and Lamictal  Home Medications   Prior to Admission medications   Medication Sig Start Date End Date Taking? Authorizing Provider   cyclobenzaprine (FLEXERIL) 10 MG tablet Take 10 mg by mouth 3 (three) times daily as needed for muscle spasms.    Historical Provider, MD  cyclobenzaprine (FLEXERIL) 10 MG tablet Take 10 mg by mouth 3 (three) times daily as needed for muscle spasms.    Historical Provider, MD  escitalopram (LEXAPRO) 10 MG tablet Take 1 tablet (10 mg total) by mouth daily. 02/20/14   Ambrose FinlandValerie A Keck, NP  meloxicam (MOBIC) 15 MG tablet Take 15 mg by mouth daily.    Historical Provider, MD  meloxicam (MOBIC) 15 MG tablet Take 15 mg by mouth daily.    Historical Provider, MD  metroNIDAZOLE (FLAGYL) 500 MG tablet Take 500 mg by mouth 2 (two) times daily.    Historical Provider, MD  trimethoprim-polymyxin b (POLYTRIM) ophthalmic solution Place 1 drop into the left eye every 4 (four) hours. 05/20/14   Reuben Likesavid C Keller, MD   BP 126/65 mmHg  Pulse 79  Temp(Src) 98.4 F (36.9 C) (Oral)  Resp 22  Ht 5' (1.524 m)  Wt 215 lb (97.523 kg)  BMI 41.99 kg/m2  SpO2 99%  LMP 05/07/2014 Physical Exam  Constitutional: [name], place, and time. She appears well-developed and well-nourished. No distress.  HENT:  Head: Normocephalic and atraumatic.  Eyes: Conjunctivae are normal. No scleral icterus.  Neck: Normal range of motion.  Cardiovascular: Normal rate, regular rhythm and normal heart sounds.  Exam reveals no gallop and no friction rub.   No murmur heard. Pulmonary/Chest: Effort normal and breath sounds normal. No respiratory distress.  Abdominal: Soft. Bowel sounds are normal. She exhibits no distension and no mass. There is no tenderness. There is no guarding.  Neurological: She is alert and oriented to person, place, and time.  Skin: Skin is warm and dry. She is not diaphoretic.  Large, erythematous, nontender wheals with central lesion that looks consistent with insect bite. , no central fluctuance no streaking, no induration.    ED Course  Procedures (including critical care time) Labs Review Labs  Reviewed - No data to display  Imaging Review No results found.   EKG Interpretation None      MDM   Final diagnoses:  Insect bites    BP 126/65 mmHg  Pulse 79  Temp(Src) 98.4 F (36.9 C) (Oral)  Resp 22  Ht 5' (1.524 m)  Wt 215 lb (97.523 kg)  BMI 41.99 kg/m2  SpO2 99%  LMP 05/07/2014 Patient with large local reaction to insect bites.  no sign of anaphylaxis, systemic reaction or infection.  Patient will receive PO prednisone here and a short course of steroid/bendarly.    Arthor Captainbigail Nessa Ramaker, PA-C 05/28/14 1329  Gwyneth SproutWhitney Plunkett, MD 05/28/14 2105

## 2014-06-27 ENCOUNTER — Encounter: Payer: Self-pay | Admitting: Family Medicine

## 2014-07-14 ENCOUNTER — Emergency Department (INDEPENDENT_AMBULATORY_CARE_PROVIDER_SITE_OTHER)
Admission: EM | Admit: 2014-07-14 | Discharge: 2014-07-14 | Disposition: A | Discharge status: Home / Self Care | Payer: Self-pay | Source: Home / Self Care | Attending: Family Medicine | Admitting: Family Medicine

## 2014-07-14 ENCOUNTER — Encounter (HOSPITAL_COMMUNITY): Payer: Self-pay | Admitting: Emergency Medicine

## 2014-07-14 DIAGNOSIS — W57XXXA Bitten or stung by nonvenomous insect and other nonvenomous arthropods, initial encounter: Secondary | ICD-10-CM

## 2014-07-14 DIAGNOSIS — T148 Other injury of unspecified body region: Secondary | ICD-10-CM

## 2014-07-14 MED ORDER — PERMETHRIN 5 % EX CREA: TOPICAL_CREAM | CUTANEOUS | Status: DC

## 2014-07-14 MED ORDER — FLUTICASONE PROPIONATE 0.05 % EX CREA: TOPICAL_CREAM | Freq: Two times a day (BID) | CUTANEOUS | Status: DC

## 2014-07-14 NOTE — ED Provider Notes (Signed)
CSN: [id number]     Arrival date & time 07/14/14  1706 History   First MD Initiated Contact with Patient 07/14/14 1800     Chief Complaint  Patient presents with  . Insect Bite   (Consider location/radiation/quality/duration/timing/severity/associated sxs/prior Treatment) Patient is a 28 y.o. female presenting with rash. The history is provided by the patient.  Rash Location:  Full body Quality: itchiness and redness   Quality: not painful   Severity:  Mild Onset quality:  Gradual Duration:  2 weeks Progression:  Unchanged Chronicity:  Recurrent Context: sick contacts   Context comment:  Daughter with similar rash Relieved by:  None tried Worsened by:  Nothing tried Ineffective treatments:  None tried Associated symptoms: no fever     Past Medical History  Diagnosis Date  . Bipolar disorder   . Anemia   . Endometriosis     ENDOMETRIOSIS???  . Pseudoseizures   . Chronic chest pain    Past Surgical History  Procedure Laterality Date  . Cesarean section    . Pelvic laparoscopy    . Cesarean section classical     Family History  Problem Relation Age of Onset  . Diabetes Mother   . Hypertension Mother   . Heart disease Mother    History  Substance Use Topics  . Smoking status: Former Games developermoker  . Smokeless tobacco: Not on file  . Alcohol Use: No   OB History    Gravida Para Term Preterm AB TAB SAB Ectopic Multiple Living   1 1 1       1      Review of Systems  Constitutional: Negative.  Negative for fever.  Skin: Positive for rash.    Allergies  Contrast media; Iohexol; Ivp dye; Lamotrigine; and Lamictal  Home Medications   Prior to Admission medications   Medication Sig Start Date End Date Taking? Authorizing Provider  cyclobenzaprine (FLEXERIL) 10 MG tablet Take 10 mg by mouth 3 (three) times daily as needed for muscle spasms.    Historical Provider, MD  cyclobenzaprine (FLEXERIL) 10 MG tablet Take 10 mg by mouth 3 (three) times daily as needed for  muscle spasms.    Historical Provider, MD  diphenhydrAMINE (BENADRYL) 25 MG tablet Take 1 tablet (25 mg total) by mouth every 6 (six) hours as needed for itching (Rash). 05/28/14   Arthor CaptainAbigail Harris, PA-C  escitalopram (LEXAPRO) 10 MG tablet Take 1 tablet (10 mg total) by mouth daily. 02/20/14   Ambrose FinlandValerie A Keck, NP  fluticasone (CUTIVATE) 0.05 % cream Apply topically 2 (two) times daily. 07/14/14   Linna HoffJames D Danyella Mcginty, MD  hydrocortisone 1 % ointment Apply 1 application topically 2 (two) times daily. 05/28/14   Arthor CaptainAbigail Harris, PA-C  meloxicam (MOBIC) 15 MG tablet Take 15 mg by mouth daily.    Historical Provider, MD  meloxicam (MOBIC) 15 MG tablet Take 15 mg by mouth daily.    Historical Provider, MD  metroNIDAZOLE (FLAGYL) 500 MG tablet Take 500 mg by mouth 2 (two) times daily.    Historical Provider, MD  permethrin (ELIMITE) 5 % cream Use as instructed, repeat in 1 week 07/14/14   Linna HoffJames D Ocie Stanzione, MD  predniSONE (DELTASONE) 20 MG tablet Take 2 tablets (40 mg total) by mouth daily. 05/28/14   Arthor CaptainAbigail Harris, PA-C  trimethoprim-polymyxin b (POLYTRIM) ophthalmic solution Place 1 drop into the left eye every 4 (four) hours. 05/20/14   Reuben Likesavid C Keller, MD   BP 130/79 mmHg  Pulse 92  Temp(Src) 97.7 F (36.5 C) (  Oral)  Resp 16  SpO2 99%  LMP 06/23/2014 Physical Exam  Constitutional: [name], place, and time. She appears well-developed and well-nourished. No distress.  Musculoskeletal: Normal range of motion.  Neurological: She is alert and oriented to person, place, and time.  Skin: Skin is warm and dry. Rash noted. There is erythema.  Raised pruritic erythematous lesions on bilat forearms and scattered over body.  Nursing note and vitals reviewed.   ED Course  Procedures (including critical care time) Labs Review Labs Reviewed - No data to display  Imaging Review No results found.   MDM   1. Multiple insect bites        Linna HoffJames D Teaghan Formica, MD 07/14/14 [phone]

## 2014-07-14 NOTE — ED Notes (Signed)
Pt states that she has been bite by what she believes is fleas

## 2014-09-26 ENCOUNTER — Encounter: Payer: Self-pay | Admitting: Internal Medicine

## 2014-09-26 ENCOUNTER — Ambulatory Visit: Payer: Self-pay | Attending: Internal Medicine | Admitting: Internal Medicine

## 2014-09-26 VITALS — BP 116/79 | HR 83 | Temp 98.8°F | Resp 16 | Ht 60.0 in | Wt 239.0 lb

## 2014-09-26 DIAGNOSIS — K0889 Other specified disorders of teeth and supporting structures: Secondary | ICD-10-CM

## 2014-09-26 DIAGNOSIS — K088 Other specified disorders of teeth and supporting structures: Secondary | ICD-10-CM

## 2014-09-26 DIAGNOSIS — D649 Anemia, unspecified: Secondary | ICD-10-CM | POA: Insufficient documentation

## 2014-09-26 DIAGNOSIS — N926 Irregular menstruation, unspecified: Secondary | ICD-10-CM

## 2014-09-26 DIAGNOSIS — F319 Bipolar disorder, unspecified: Secondary | ICD-10-CM | POA: Insufficient documentation

## 2014-09-26 DIAGNOSIS — Z87891 Personal history of nicotine dependence: Secondary | ICD-10-CM | POA: Insufficient documentation

## 2014-09-26 DIAGNOSIS — Z791 Long term (current) use of non-steroidal anti-inflammatories (NSAID): Secondary | ICD-10-CM | POA: Insufficient documentation

## 2014-09-26 LAB — POCT URINE PREGNANCY: PREG TEST UR: NEGATIVE

## 2014-09-26 NOTE — Progress Notes (Signed)
Patient ID: Kaitlyn [name], female   DOB: 01/21/1986, 29 y.o.   MRN: [id number]  CC: infertility  HPI: Kaitlyn [name] is a 29 y.o. female here today for a follow up visit.  Patient has past medical history of of bipolar disorder and anemia. Patient reports that her cycle comes every 25-28 days.  Cycles are normal flow. She has been off Depo provera for four years. She states that she was told she may have endometriosis, but it was never confirmed. She denies any pain with menstrual cycles. No history of PCOS.  Patient has No headache, No chest pain, No abdominal pain - No Nausea, No new weakness tingling or numbness, No Cough - SOB.  Allergies  Allergen Reactions  . Contrast Media [Iodinated Diagnostic Agents] Hives and Itching  . Iohexol      Code: HIVES, Desc: nausea/vomiting-itching/hives/rash after mri contrast multihance-benedryl was given by radiologist-then pt had seizure-pt was monitored and released to family's care, Onset Date: [id number]   . Ivp Dye [Iodinated Diagnostic Agents] Hives and Itching  . Lamotrigine Hives  . Lamictal [Lamotrigine] Rash   Past Medical History  Diagnosis Date  . Bipolar disorder   . Anemia   . Endometriosis     ENDOMETRIOSIS???  . Pseudoseizures   . Chronic chest pain    Current Outpatient Prescriptions on File Prior to Visit  Medication Sig Dispense Refill  . cyclobenzaprine (FLEXERIL) 10 MG tablet Take 10 mg by mouth 3 (three) times daily as needed for muscle spasms.    . cyclobenzaprine (FLEXERIL) 10 MG tablet Take 10 mg by mouth 3 (three) times daily as needed for muscle spasms.    . diphenhydrAMINE (BENADRYL) 25 MG tablet Take 1 tablet (25 mg total) by mouth every 6 (six) hours as needed for itching (Rash). 10 tablet 0  . escitalopram (LEXAPRO) 10 MG tablet Take 1 tablet (10 mg total) by mouth daily. (Patient not taking: Reported on 09/26/2014) 30 tablet 1  . fluticasone (CUTIVATE) 0.05 % cream Apply topically 2 (two) times daily. (Patient  not taking: Reported on 09/26/2014) 30 g 1  . hydrocortisone 1 % ointment Apply 1 application topically 2 (two) times daily. (Patient not taking: Reported on 09/26/2014) 30 g 0  . meloxicam (MOBIC) 15 MG tablet Take 15 mg by mouth daily.    . meloxicam (MOBIC) 15 MG tablet Take 15 mg by mouth daily.    . metroNIDAZOLE (FLAGYL) 500 MG tablet Take 500 mg by mouth 2 (two) times daily.    . permethrin (ELIMITE) 5 % cream Use as instructed, repeat in 1 week (Patient not taking: Reported on 09/26/2014) 60 g 1  . predniSONE (DELTASONE) 20 MG tablet Take 2 tablets (40 mg total) by mouth daily. (Patient not taking: Reported on 09/26/2014) 10 tablet 0  . trimethoprim-polymyxin b (POLYTRIM) ophthalmic solution Place 1 drop into the left eye every 4 (four) hours. (Patient not taking: Reported on 09/26/2014) 10 mL 0   No current facility-administered medications on file prior to visit.   Family History  Problem Relation Age of Onset  . Diabetes Mother   . Hypertension Mother   . Heart disease Mother    History   Social History  . Marital Status: Married    Spouse Name: N/A  . Number of Children: N/A  . Years of Education: N/A   Occupational History  . Not on file.   Social History Main Topics  . Smoking status: Former Games developer  . Smokeless tobacco: Not on file  .  Alcohol Use: No  . Drug Use: No  . Sexual Activity: Yes    Birth Control/ Protection: Condom, None   Other Topics Concern  . Not on file   Social History Narrative   ** Merged History Encounter **        Review of Systems: See history of present illness   Objective:   Filed Vitals:   09/26/14 1446  BP: 116/79  Pulse: 83  Temp: 98.8 F (37.1 C)  Resp: 16    Physical Exam  Constitutional: [name], place, and time.  Cardiovascular: Normal rate, regular rhythm and normal heart sounds.   Pulmonary/Chest: Effort normal and breath sounds normal.  Abdominal: Soft. Bowel sounds are normal. There is no  tenderness.  Neurological: She is alert and oriented to person, place, and time.  Skin: Skin is warm and dry.     Lab Results  Component Value Date   WBC 6.4 02/04/2014   HGB 12.2 02/04/2014   HCT 36.0 02/04/2014   MCV 85.9 02/04/2014   PLT 234 02/04/2014   Lab Results  Component Value Date   CREATININE 0.70 02/04/2014   BUN 8 02/04/2014   NA 140 02/04/2014   K 3.9 02/04/2014   CL 106 02/04/2014   CO2 23 02/04/2014    Lab Results  Component Value Date   HGBA1C 5.2 12/19/2013   Lipid Panel     Component Value Date/Time   CHOL 150 12/19/2013 1008   TRIG 42 12/19/2013 1008   HDL 40 12/19/2013 1008   CHOLHDL 3.8 12/19/2013 1008   VLDL 8 12/19/2013 1008   LDLCALC 102* 12/19/2013 1008       Assessment and plan:   Kaitlyn [name] was seen today for menstrual problem.  Diagnoses and all orders for this visit:  Menses, irregular Orders: -     POCT urine pregnancy Gave patient information to some of the local infertility clinics and explained that she may be required to pay out of pocket. [facility].  Pain, dental Orders: -     Ambulatory referral to Dentistry  May follow-up as needed      Holland CommonsKECK, VALERIE, NP-C [facility] [phone] 09/26/2014, 3:03 PM

## 2014-09-26 NOTE — Progress Notes (Signed)
Irregular period  Stated trying to get pregnant  No Hx drugs, tobacco, Drugs

## 2014-10-13 ENCOUNTER — Ambulatory Visit: Payer: Self-pay

## 2014-11-24 ENCOUNTER — Ambulatory Visit: Payer: Self-pay

## 2014-12-22 ENCOUNTER — Ambulatory Visit: Payer: Self-pay

## 2015-01-06 ENCOUNTER — Ambulatory Visit: Payer: Self-pay | Attending: Internal Medicine | Admitting: Family Medicine

## 2015-01-06 VITALS — BP 131/84 | HR 98 | Temp 98.2°F | Resp 18 | Ht 60.0 in | Wt 247.6 lb

## 2015-01-06 DIAGNOSIS — N912 Amenorrhea, unspecified: Secondary | ICD-10-CM

## 2015-01-06 LAB — POCT URINE PREGNANCY: PREG TEST UR: NEGATIVE

## 2015-01-06 NOTE — Progress Notes (Signed)
Patient here with complaints of "spotting" instead of having a normal period.  Patient states she is 3-4 days behind regular cycle.  Patient thinks she might be pregnant, and if not, wants to know why she is spotting because this "isn't normal for her."  Patient has no complaints of pain at this time. Patient LMP 12/07/14-12/11/14.

## 2015-01-06 NOTE — Progress Notes (Signed)
Subjective:     Patient ID: Kaitlyn [name], female   DOB: 1986/02/22, 29 y.o.   MRN: [id number]  HPI   Patient presents today for a pregnancy test. She reports her last period was May 22-26. She normally has periods every 26 days and should therefore started on the 17th of June. She has had some spotting over the last few days but no regular flow. She is not on birth control. Is OK with getting pregnant. She denies any menstural cramping or other symptoms.  She has an appointment in about a month with GYN.   Review of Systems  Negative for other signs of pregnancy, no breast tenderness, no morning sickness.     Objective:   Physical Exam  Alert, oriented, appropriate, in no distress Skin is warm and dry, BP 131/84, pulse 98.      Assessment:     Late menstural cycle Negative Pregnancy test    Plan:     Keep appointment with GYN next month.

## 2015-01-06 NOTE — Patient Instructions (Signed)
Urine pregnancy test is negative Keep your appointment next month with GYN and discuss any issues you have about your periods. The spotting you are experiencing may be your period.

## 2015-01-28 ENCOUNTER — Ambulatory Visit: Payer: Self-pay | Admitting: Gynecology

## 2015-01-28 ENCOUNTER — Ambulatory Visit (INDEPENDENT_AMBULATORY_CARE_PROVIDER_SITE_OTHER): Payer: Commercial Managed Care - HMO | Admitting: Gynecology

## 2015-01-28 ENCOUNTER — Encounter: Payer: Self-pay | Admitting: Gynecology

## 2015-01-28 VITALS — BP 130/80 | Ht 60.0 in | Wt 248.0 lb

## 2015-01-28 DIAGNOSIS — N979 Female infertility, unspecified: Secondary | ICD-10-CM | POA: Diagnosis not present

## 2015-01-28 DIAGNOSIS — Z01419 Encounter for gynecological examination (general) (routine) without abnormal findings: Secondary | ICD-10-CM

## 2015-01-28 NOTE — Progress Notes (Signed)
Kaitlyn [name] 10/05/1985 [id number]   History:    29 y.o.  for annual gyn exam with no complaints. Patient has not been seen the office since 2012. Patient is morbidly obese. Patient has one pregnancy with previous partner. Patient using unprotected intercourse. Patient now having normal menstrual cycle. Patient has use the ovulation predictor kit very infrequent in the past. Current partner has had a child with a previous partner but has not had a semen analysis. Patient herself has had a normal HSG in the past.Patient with past history of endometriosis proven by laparoscopy and has had history of positive GC and Chlamydia cultures and treated as well.   Past medical history,surgical history, family history and social history were all reviewed and documented in the EPIC chart.  Gynecologic History Patient's last menstrual period was 01/06/2015. Contraception: none Last Pap: 2015. Results were: normal Last mammogram: Not indicated. Results were: Not indicated  Obstetric History OB History  Gravida Para Term Preterm AB SAB TAB Ectopic Multiple Living  $Remov'1 1 1       1    'etZhKZ$ # Outcome Date GA Lbr Len/2nd Weight Sex Delivery Anes PTL Lv  1 Term     F CS-Unspec   Y       ROS: A ROS was performed and pertinent positives and negatives are included in the history.  GENERAL: No fevers or chills. HEENT: No change in vision, no earache, sore throat or sinus congestion. NECK: No pain or stiffness. CARDIOVASCULAR: No chest pain or pressure. No palpitations. PULMONARY: No shortness of breath, cough or wheeze. GASTROINTESTINAL: No abdominal pain, nausea, vomiting or diarrhea, melena or bright red blood per rectum. GENITOURINARY: No urinary frequency, urgency, hesitancy or dysuria. MUSCULOSKELETAL: No joint or muscle pain, no back pain, no recent trauma. DERMATOLOGIC: No rash, no itching, no lesions. ENDOCRINE: No polyuria, polydipsia, no heat or cold intolerance. No recent change in weight. HEMATOLOGICAL:  No anemia or easy bruising or bleeding. NEUROLOGIC: No headache, seizures, numbness, tingling or weakness. PSYCHIATRIC: No depression, no loss of interest in normal activity or change in sleep pattern.     Exam: chaperone present  BP 130/80 mmHg  Ht 5' (1.524 m)  Wt 248 lb (112.492 kg)  BMI 48.43 kg/m2  LMP 01/06/2015  Body mass index is 48.43 kg/(m^2).  General appearance : Well developed well nourished female. No acute distress HEENT: Eyes: no retinal hemorrhage or exudates,  Neck supple, trachea midline, no carotid bruits, no thyroidmegaly Lungs: Clear to auscultation, no rhonchi or wheezes, or rib retractions  Heart: Regular rate and rhythm, no murmurs or gallops Breast:Examined in sitting and supine position were symmetrical in appearance, no palpable masses or tenderness,  no skin retraction, no nipple inversion, no nipple discharge, no skin discoloration, no axillary or supraclavicular lymphadenopathy Abdomen: no palpable masses or tenderness, no rebound or guarding Extremities: no edema or skin discoloration or tenderness  Pelvic:  Bartholin, Urethra, Skene Glands: Within normal limits             Vagina: No gross lesions or discharge  Cervix: No gross lesions or discharge  Uterus  anteverted, normal size, shape and consistency, non-tender and mobile  Adnexa  Without masses or tenderness  Anus and perineum  normal   Rectovaginal  normal sphincter tone without palpated masses or tenderness             Hemoccult not indicated     Assessment/Plan:  29 y.o. female for annual exam who is morbidly obese  with a BMI of 48.43 kg/m was counseled on weight reduction measures and exercise. She will use ovulation predictor kit to time or intercourse if she does not conceive over the course of the next 6 months her partner will need a semen analysis. She was instructed to take prenatal vitamins daily for neural tube defect prophylaxis. Patient to return back in a fasting state later this  week for the following screening labs: Fasting lipid profile, compresses a metabolic panel, fasting lipid profile, TSH, and urinalysis. Pap smear is not indicated today.   Terrance Mass MD, 3:41 PM 01/28/2015

## 2015-01-30 ENCOUNTER — Other Ambulatory Visit: Payer: Commercial Managed Care - HMO

## 2015-01-30 DIAGNOSIS — Z01419 Encounter for gynecological examination (general) (routine) without abnormal findings: Secondary | ICD-10-CM

## 2015-01-30 LAB — CBC WITH DIFFERENTIAL/PLATELET
BASOS ABS: 0 10*3/uL (ref 0.0–0.1)
BASOS PCT: 0 % (ref 0–1)
EOS ABS: 0.1 10*3/uL (ref 0.0–0.7)
Eosinophils Relative: 2 % (ref 0–5)
HCT: 33.1 % — ABNORMAL LOW (ref 36.0–46.0)
HEMOGLOBIN: 10.8 g/dL — AB (ref 12.0–15.0)
Lymphocytes Relative: 39 % (ref 12–46)
Lymphs Abs: 1.8 10*3/uL (ref 0.7–4.0)
MCH: 27.8 pg (ref 26.0–34.0)
MCHC: 32.6 g/dL (ref 30.0–36.0)
MCV: 85.1 fL (ref 78.0–100.0)
MONO ABS: 0.6 10*3/uL (ref 0.1–1.0)
MPV: 10.3 fL (ref 8.6–12.4)
Monocytes Relative: 13 % — ABNORMAL HIGH (ref 3–12)
Neutro Abs: 2.1 10*3/uL (ref 1.7–7.7)
Neutrophils Relative %: 46 % (ref 43–77)
Platelets: 220 10*3/uL (ref 150–400)
RBC: 3.89 MIL/uL (ref 3.87–5.11)
RDW: 15.2 % (ref 11.5–15.5)
WBC: 4.5 10*3/uL (ref 4.0–10.5)

## 2015-01-30 LAB — COMPREHENSIVE METABOLIC PANEL
ALT: <8 U/L (ref 0–35)
AST: 13 U/L (ref 0–37)
Albumin: 3.4 g/dL — ABNORMAL LOW (ref 3.5–5.2)
Alkaline Phosphatase: 59 U/L (ref 39–117)
BILIRUBIN TOTAL: 0.3 mg/dL (ref 0.2–1.2)
BUN: 12 mg/dL (ref 6–23)
CO2: 25 mEq/L (ref 19–32)
Calcium: 8.7 mg/dL (ref 8.4–10.5)
Chloride: 106 mEq/L (ref 96–112)
Creat: 0.61 mg/dL (ref 0.50–1.10)
Glucose, Bld: 92 mg/dL (ref 70–99)
Potassium: 4 mEq/L (ref 3.5–5.3)
SODIUM: 139 meq/L (ref 135–145)
TOTAL PROTEIN: 6.4 g/dL (ref 6.0–8.3)

## 2015-01-30 LAB — LIPID PANEL
CHOL/HDL RATIO: 3.9 ratio
Cholesterol: 150 mg/dL (ref 0–200)
HDL: 38 mg/dL — AB (ref >=46)
LDL Cholesterol: 99 mg/dL (ref 0–99)
Triglycerides: 64 mg/dL (ref <150)
VLDL: 13 mg/dL (ref 0–40)

## 2015-01-30 LAB — TSH: TSH: 2.157 u[IU]/mL (ref 0.350–4.500)

## 2015-01-31 LAB — URINALYSIS W MICROSCOPIC + REFLEX CULTURE
Bacteria, UA: NONE SEEN
Bilirubin Urine: NEGATIVE
Casts: NONE SEEN
Crystals: NONE SEEN
Glucose, UA: NEGATIVE mg/dL
Hgb urine dipstick: NEGATIVE
Ketones, ur: NEGATIVE mg/dL
LEUKOCYTES UA: NEGATIVE
Nitrite: NEGATIVE
PROTEIN: NEGATIVE mg/dL
Specific Gravity, Urine: 1.03 (ref 1.005–1.030)
Squamous Epithelial / LPF: NONE SEEN
UROBILINOGEN UA: 1 mg/dL (ref 0.0–1.0)
pH: 6.5 (ref 5.0–8.0)

## 2015-02-02 ENCOUNTER — Encounter: Payer: Self-pay | Admitting: Gynecology

## 2015-02-02 ENCOUNTER — Other Ambulatory Visit: Payer: Self-pay | Admitting: Gynecology

## 2015-02-02 ENCOUNTER — Telehealth: Payer: Self-pay

## 2015-02-02 DIAGNOSIS — D649 Anemia, unspecified: Secondary | ICD-10-CM

## 2015-02-02 NOTE — Telephone Encounter (Signed)
Correct.  Thanks. 

## 2015-02-02 NOTE — Telephone Encounter (Signed)
Patient informed of below result. She asked if she should take the daily iron tablet since she is taking a prenatal vitamin. I told her she should since she needs more iron than is in the PNV. Ok?

## 2015-02-02 NOTE — Telephone Encounter (Signed)
-----   Message from Ok EdwardsJuan H Fernandez, MD sent at 02/02/2015  9:24 AM EDT ----- Please inform patient I need for her to come to the office to get a full anemia panel blood test. Also make sure that she is taking her arm tablet daily.

## 2015-02-04 ENCOUNTER — Other Ambulatory Visit: Payer: Commercial Managed Care - HMO

## 2015-02-04 DIAGNOSIS — D649 Anemia, unspecified: Secondary | ICD-10-CM

## 2015-02-04 LAB — IRON AND TIBC
%SAT: 11 % — AB (ref 20–55)
Iron: 31 ug/dL — ABNORMAL LOW (ref 42–145)
TIBC: 278 ug/dL (ref 250–470)
UIBC: 247 ug/dL (ref 125–400)

## 2015-02-04 LAB — VITAMIN B12: Vitamin B-12: 560 pg/mL (ref 211–911)

## 2015-02-04 LAB — FERRITIN: FERRITIN: 69 ng/mL (ref 10–291)

## 2015-02-04 LAB — FOLATE: FOLATE: 11.6 ng/mL

## 2015-02-05 ENCOUNTER — Other Ambulatory Visit: Payer: Self-pay | Admitting: Gynecology

## 2015-02-05 DIAGNOSIS — D649 Anemia, unspecified: Secondary | ICD-10-CM

## 2015-04-27 ENCOUNTER — Other Ambulatory Visit: Payer: Commercial Managed Care - HMO

## 2015-04-27 DIAGNOSIS — D649 Anemia, unspecified: Secondary | ICD-10-CM

## 2015-04-27 LAB — CBC WITH DIFFERENTIAL/PLATELET
BASOS PCT: 0 % (ref 0–1)
Basophils Absolute: 0 10*3/uL (ref 0.0–0.1)
Eosinophils Absolute: 0 10*3/uL (ref 0.0–0.7)
Eosinophils Relative: 1 % (ref 0–5)
HCT: 33.9 % — ABNORMAL LOW (ref 36.0–46.0)
HEMOGLOBIN: 11 g/dL — AB (ref 12.0–15.0)
Lymphocytes Relative: 43 % (ref 12–46)
Lymphs Abs: 2.1 10*3/uL (ref 0.7–4.0)
MCH: 27.4 pg (ref 26.0–34.0)
MCHC: 32.4 g/dL (ref 30.0–36.0)
MCV: 84.3 fL (ref 78.0–100.0)
MPV: 12.2 fL (ref 8.6–12.4)
Monocytes Absolute: 0.5 10*3/uL (ref 0.1–1.0)
Monocytes Relative: 11 % (ref 3–12)
NEUTROS ABS: 2.2 10*3/uL (ref 1.7–7.7)
NEUTROS PCT: 45 % (ref 43–77)
Platelets: 256 10*3/uL (ref 150–400)
RBC: 4.02 MIL/uL (ref 3.87–5.11)
RDW: 14.6 % (ref 11.5–15.5)
WBC: 4.8 10*3/uL (ref 4.0–10.5)

## 2015-06-04 ENCOUNTER — Telehealth: Payer: Self-pay

## 2015-06-04 ENCOUNTER — Other Ambulatory Visit: Payer: Self-pay | Admitting: Gynecology

## 2015-06-04 DIAGNOSIS — N920 Excessive and frequent menstruation with regular cycle: Secondary | ICD-10-CM

## 2015-06-04 NOTE — Telephone Encounter (Signed)
Last night cramping. This morning crampy and a little spotting. Has never done this before.  LMP 05-26-15 was a normal period. She has been trying to conceived.  She went to an urgent care center today.  They did a UPT and it was  negative. The doctor there recommended that she could have a blood pregnancy test to be sure. She wanted to see if you would order that for her to come by and have drawn?

## 2015-06-04 NOTE — Telephone Encounter (Signed)
Patient informed. Order placed.

## 2015-06-04 NOTE — Telephone Encounter (Signed)
Yes she can come by for serum HCG tomorrow

## 2015-06-05 ENCOUNTER — Other Ambulatory Visit: Payer: Commercial Managed Care - HMO

## 2015-06-05 DIAGNOSIS — N920 Excessive and frequent menstruation with regular cycle: Secondary | ICD-10-CM

## 2015-06-06 LAB — HCG, SERUM, QUALITATIVE: Preg, Serum: NEGATIVE

## 2015-06-10 ENCOUNTER — Telehealth: Payer: Self-pay

## 2015-06-10 NOTE — Telephone Encounter (Signed)
Calling for result of serum pregnancy test. Informed it was negative. She said has scheduled appt to have cramping checked.

## 2015-07-02 ENCOUNTER — Ambulatory Visit (INDEPENDENT_AMBULATORY_CARE_PROVIDER_SITE_OTHER): Payer: Commercial Managed Care - HMO | Admitting: Gynecology

## 2015-07-02 ENCOUNTER — Encounter: Payer: Self-pay | Admitting: Gynecology

## 2015-07-02 VITALS — BP 134/88

## 2015-07-02 DIAGNOSIS — N760 Acute vaginitis: Secondary | ICD-10-CM

## 2015-07-02 DIAGNOSIS — A499 Bacterial infection, unspecified: Secondary | ICD-10-CM

## 2015-07-02 DIAGNOSIS — N979 Female infertility, unspecified: Secondary | ICD-10-CM

## 2015-07-02 DIAGNOSIS — R102 Pelvic and perineal pain: Secondary | ICD-10-CM

## 2015-07-02 DIAGNOSIS — B9689 Other specified bacterial agents as the cause of diseases classified elsewhere: Secondary | ICD-10-CM

## 2015-07-02 DIAGNOSIS — N920 Excessive and frequent menstruation with regular cycle: Secondary | ICD-10-CM

## 2015-07-02 DIAGNOSIS — N898 Other specified noninflammatory disorders of vagina: Secondary | ICD-10-CM

## 2015-07-02 LAB — WET PREP FOR TRICH, YEAST, CLUE
Trich, Wet Prep: NONE SEEN
Yeast Wet Prep HPF POC: NONE SEEN

## 2015-07-02 MED ORDER — METRONIDAZOLE 500 MG PO TABS: 500.0000 mg | ORAL_TABLET | Freq: Two times a day (BID) | ORAL | Status: DC

## 2015-07-02 NOTE — Patient Instructions (Signed)
Metronidazole tablets or capsules What is this medicine? METRONIDAZOLE (me troe NI da zole) is an antiinfective. It is used to treat certain kinds of bacterial and protozoal infections. It will not work for colds, flu, or other viral infections. This medicine may be used for other purposes; ask your health care provider or pharmacist if you have questions. What should I tell my health care provider before I take this medicine? They need to know if you have any of these conditions: -anemia or other blood disorders -disease of the nervous system -fungal or yeast infection -if you drink alcohol containing drinks -liver disease -seizures -an unusual or allergic reaction to metronidazole, or other medicines, foods, dyes, or preservatives -pregnant or trying to get pregnant -breast-feeding How should I use this medicine? Take this medicine by mouth with a full glass of water. Follow the directions on the prescription label. Take your medicine at regular intervals. Do not take your medicine more often than directed. Take all of your medicine as directed even if you think you are better. Do not skip doses or stop your medicine early. Talk to your pediatrician regarding the use of this medicine in children. Special care may be needed. Overdosage: If you think you have taken too much of this medicine contact a poison control center or emergency room at once. NOTE: This medicine is only for you. Do not share this medicine with others. What if I miss a dose? If you miss a dose, take it as soon as you can. If it is almost time for your next dose, take only that dose. Do not take double or extra doses. What may interact with this medicine? Do not take this medicine with any of the following medications: -alcohol or any product that contains alcohol -amprenavir oral solution -cisapride -disulfiram -dofetilide -dronedarone -paclitaxel injection -pimozide -ritonavir oral solution -sertraline oral  solution -sulfamethoxazole-trimethoprim injection -thioridazine -ziprasidone This medicine may also interact with the following medications: -birth control pills -cimetidine -lithium -other medicines that prolong the QT interval (cause an abnormal heart rhythm) -phenobarbital -phenytoin -warfarin This list may not describe all possible interactions. Give your health care provider a list of all the medicines, herbs, non-prescription drugs, or dietary supplements you use. Also tell them if you smoke, drink alcohol, or use illegal drugs. Some items may interact with your medicine. What should I watch for while using this medicine? Tell your doctor or health care professional if your symptoms do not improve or if they get worse. You may get drowsy or dizzy. Do not drive, use machinery, or do anything that needs mental alertness until you know how this medicine affects you. Do not stand or sit up quickly, especially if you are an older patient. This reduces the risk of dizzy or fainting spells. Avoid alcoholic drinks while you are taking this medicine and for three days afterward. Alcohol may make you feel dizzy, sick, or flushed. If you are being treated for a sexually transmitted disease, avoid sexual contact until you have finished your treatment. Your sexual partner may also need treatment. What side effects may I notice from receiving this medicine? Side effects that you should report to your doctor or health care professional as soon as possible: -allergic reactions like skin rash or hives, swelling of the face, lips, or tongue -confusion, clumsiness -difficulty speaking -discolored or sore mouth -dizziness -fever, infection -numbness, tingling, pain or weakness in the hands or feet -trouble passing urine or change in the amount of urine -redness, blistering, peeling   or loosening of the skin, including inside the mouth -seizures -unusually weak or tired -vaginal irritation, dryness,  or discharge Side effects that usually do not require medical attention (report to your doctor or health care professional if they continue or are bothersome): -diarrhea -headache -irritability -metallic taste -nausea -stomach pain or cramps -trouble sleeping This list may not describe all possible side effects. Call your doctor for medical advice about side effects. You may report side effects to FDA at 1-800-FDA-1088. Where should I keep my medicine? Keep out of the reach of children. Store at room temperature below 25 degrees C (77 degrees F). Protect from light. Keep container tightly closed. Throw away any unused medicine after the expiration date. NOTE: This sheet is a summary. It may not cover all possible information. If you have questions about this medicine, talk to your doctor, pharmacist, or health care provider.    2016, Elsevier/Gold Standard. (2013-02-08 14:08:39) Bacterial Vaginosis Bacterial vaginosis is a vaginal infection that occurs when the normal balance of bacteria in the vagina is disrupted. It results from an overgrowth of certain bacteria. This is the most common vaginal infection in women of childbearing age. Treatment is important to prevent complications, especially in pregnant women, as it can cause a premature delivery. CAUSES  Bacterial vaginosis is caused by an increase in harmful bacteria that are normally present in smaller amounts in the vagina. Several different kinds of bacteria can cause bacterial vaginosis. However, the reason that the condition develops is not fully understood. RISK FACTORS Certain activities or behaviors can put you at an increased risk of developing bacterial vaginosis, including:  Having a new sex partner or multiple sex partners.  Douching.  Using an intrauterine device (IUD) for contraception. Women do not get bacterial vaginosis from toilet seats, bedding, swimming pools, or contact with objects around them. SIGNS AND  SYMPTOMS  Some women with bacterial vaginosis have no signs or symptoms. Common symptoms include:  Grey vaginal discharge.  A fishlike odor with discharge, especially after sexual intercourse.  Itching or burning of the vagina and vulva.  Burning or pain with urination. DIAGNOSIS  Your health care provider will take a medical history and examine the vagina for signs of bacterial vaginosis. A sample of vaginal fluid may be taken. Your health care provider will look at this sample under a microscope to check for bacteria and abnormal cells. A vaginal pH test may also be done.  TREATMENT  Bacterial vaginosis may be treated with antibiotic medicines. These may be given in the form of a pill or a vaginal cream. A second round of antibiotics may be prescribed if the condition comes back after treatment. Because bacterial vaginosis increases your risk for sexually transmitted diseases, getting treated can help reduce your risk for chlamydia, gonorrhea, HIV, and herpes. HOME CARE INSTRUCTIONS   Only take over-the-counter or prescription medicines as directed by your health care provider.  If antibiotic medicine was prescribed, take it as directed. Make sure you finish it even if you start to feel better.  Tell all sexual partners that you have a vaginal infection. They should see their health care provider and be treated if they have problems, such as a mild rash or itching.  During treatment, it is important that you follow these instructions:  Avoid sexual activity or use condoms correctly.  Do not douche.  Avoid alcohol as directed by your health care provider.  Avoid breastfeeding as directed by your health care provider. SEEK MEDICAL CARE IF:     Your symptoms are not improving after 3 days of treatment.  You have increased discharge or pain.  You have a fever. MAKE SURE YOU:   Understand these instructions.  Will watch your condition.  Will get help right away if you are not  doing well or get worse. FOR MORE INFORMATION  Centers for Disease Control and Prevention, Division of STD Prevention: www.cdc.gov/std American Sexual Health Association (ASHA): www.ashastd.org    This information is not intended to replace advice given to you by your health care provider. Make sure you discuss any questions you have with your health care provider.   Document Released: 07/04/2005 Document Revised: 07/25/2014 Document Reviewed: 02/13/2013 Elsevier Interactive Patient Education 2016 Elsevier Inc.  

## 2015-07-02 NOTE — Progress Notes (Signed)
   Patient 29 year old gravida 1 para 1 with history of secondary infertility presented to the office today stating to the past couple months she's noted that she has spotted a couple days in the month in between cycles. She has not skipped a menstrual cycle. She was also complaining of slight vaginal discharge and some low abdominal cramping.  Exam: Gen. appearance overweight patient in no acute distress Back: No CVA tenderness Abdomen: Soft nontender no rebound or guarding Pelvic: Bartholin urethra Skene was within normal limits Vagina: Slight fishy odor discharge with erythematous vaginal mucosa Cervix: No unusual lesions seen GC and Chlamydia culture obtained along with wet prep Uterus: Upper limits of normal nontender uterus Adnexa: No palpable masses or tenderness Rectal exam not done  Wet prep few clue cells, rare WBCs and to numerous to count bacteria  Patient had a normal HSG several years ago her new partner has not had a semen analysis and will need to be coordinated. She has also had children many years ago with a different partner.  Assessment for/plan: Intermenstrual spotting may be due to recent infection. She'll be treated with Flagyl 500 mg twice a day for 7 days for bacterial vaginosis. Will await the results of the GC and Chlamydia culture. If she continues to have spotting and cramping she'll return back in January for an ultrasound. Her husband to make arrangements for semen analysis. Patient refuses flu vaccine.

## 2015-07-03 ENCOUNTER — Ambulatory Visit: Payer: Self-pay | Attending: Internal Medicine

## 2015-07-03 LAB — GC/CHLAMYDIA PROBE AMP
CT Probe RNA: NOT DETECTED
GC Probe RNA: NOT DETECTED

## 2015-08-03 ENCOUNTER — Encounter: Payer: Self-pay | Admitting: Internal Medicine

## 2015-08-03 ENCOUNTER — Ambulatory Visit: Payer: Self-pay | Attending: Internal Medicine | Admitting: Internal Medicine

## 2015-08-03 ENCOUNTER — Telehealth: Payer: Self-pay

## 2015-08-03 VITALS — BP 104/76 | HR 76 | Temp 98.2°F | Resp 17 | Ht 60.0 in | Wt 254.6 lb

## 2015-08-03 DIAGNOSIS — D649 Anemia, unspecified: Secondary | ICD-10-CM | POA: Insufficient documentation

## 2015-08-03 DIAGNOSIS — Z888 Allergy status to other drugs, medicaments and biological substances status: Secondary | ICD-10-CM | POA: Insufficient documentation

## 2015-08-03 DIAGNOSIS — N809 Endometriosis, unspecified: Secondary | ICD-10-CM | POA: Insufficient documentation

## 2015-08-03 DIAGNOSIS — K0889 Other specified disorders of teeth and supporting structures: Secondary | ICD-10-CM | POA: Insufficient documentation

## 2015-08-03 DIAGNOSIS — F319 Bipolar disorder, unspecified: Secondary | ICD-10-CM | POA: Insufficient documentation

## 2015-08-03 DIAGNOSIS — Z79899 Other long term (current) drug therapy: Secondary | ICD-10-CM | POA: Insufficient documentation

## 2015-08-03 DIAGNOSIS — Z87891 Personal history of nicotine dependence: Secondary | ICD-10-CM | POA: Insufficient documentation

## 2015-08-03 NOTE — Telephone Encounter (Signed)
Patient called to say that she had regular period in Jan but a week later began spotting and having the feeling she was starting a period but did not just spotted.  Dr. Glenetta HewJF had recommended u/s if any more abnormal bleeding/spotting. Patient wants to schedule but needs to discuss costs as she is private pay. I will have appt desk contact her.

## 2015-08-03 NOTE — Progress Notes (Addendum)
Patient ID: Kaitlyn [name], female   DOB: 03/18/1986, 30 y.o.   MRN: [id number]  CC: dental referral  HPI: Kaitlyn [name] is a 30 y.o. female here today for a follow up visit.  Patient has past medical history of bipolar disorder. Patient reports that she has been having left top and bottom molar pain. She states that she [facility] and was told she needed to have a top molar removed and a wisdom tooth removed after having x-rays. Patient states that she is having pain that keeps her up at night. No facial swelling, gum bleeding, fevers.  Allergies  Allergen Reactions  . Contrast Media [Iodinated Diagnostic Agents] Hives and Itching  . Iohexol      Code: HIVES, Desc: nausea/vomiting-itching/hives/rash after mri contrast multihance-benedryl was given by radiologist-then pt had seizure-pt was monitored and released to family's care, Onset Date: [id number]   . Ivp Dye [Iodinated Diagnostic Agents] Hives and Itching  . Lamotrigine Hives  . Lamictal [Lamotrigine] Rash   Past Medical History  Diagnosis Date  . Bipolar disorder (HCC)   . Anemia   . Endometriosis     ENDOMETRIOSIS???  . Pseudoseizures   . Chronic chest pain    Current Outpatient Prescriptions on File Prior to Visit  Medication Sig Dispense Refill  . cyclobenzaprine (FLEXERIL) 10 MG tablet Take 10 mg by mouth 3 (three) times daily as needed for muscle spasms. Reported on 08/03/2015    . diphenhydrAMINE (BENADRYL) 25 MG tablet Take 1 tablet (25 mg total) by mouth every 6 (six) hours as needed for itching (Rash). (Patient not taking: Reported on 07/02/2015) 10 tablet 0  . meloxicam (MOBIC) 15 MG tablet Take 15 mg by mouth as needed. Reported on 08/03/2015    . metroNIDAZOLE (FLAGYL) 500 MG tablet Take 1 tablet (500 mg total) by mouth 2 (two) times daily. (Patient not taking: Reported on 08/03/2015) 14 tablet 0   No current facility-administered medications on file prior to visit.   Family  History  Problem Relation Age of Onset  . Diabetes Mother   . Hypertension Mother   . Heart disease Mother    Social History   Social History  . Marital Status: Married    Spouse Name: N/A  . Number of Children: N/A  . Years of Education: N/A   Occupational History  . Not on file.   Social History Main Topics  . Smoking status: Former Smoker    Quit date: 01/06/2011  . Smokeless tobacco: Not on file  . Alcohol Use: No  . Drug Use: No  . Sexual Activity: Yes    Birth Control/ Protection: Condom, None     Comment: inercourse age 30 sexual partners less than 5   Other Topics Concern  . Not on file   Social History Narrative   ** Merged History Encounter **        Review of Systems: Other than what is stated in HPI, all other systems are negative.   Objective:   Filed Vitals:   08/03/15 1001  BP: 104/76  Pulse: 76  Temp: 98.2 F (36.8 C)  Resp: 17    Physical Exam  Constitutional: [name], place, and time.  HENT:  Mouth/Throat: Oropharynx is clear and moist. Dental caries present.  Neurological: She is alert and oriented to person, place, and time.     Lab Results  Component Value Date   WBC 4.8 04/27/2015   HGB 11.0*  04/27/2015   HCT 33.9* 04/27/2015   MCV 84.3 04/27/2015   PLT 256 04/27/2015   Lab Results  Component Value Date   CREATININE 0.61 01/30/2015   BUN 12 01/30/2015   NA 139 01/30/2015   K 4.0 01/30/2015   CL 106 01/30/2015   CO2 25 01/30/2015    Lab Results  Component Value Date   HGBA1C 5.2 12/19/2013   Lipid Panel     Component Value Date/Time   CHOL 150 01/30/2015 0833   TRIG 64 01/30/2015 0833   HDL 38* 01/30/2015 0833   CHOLHDL 3.9 01/30/2015 0833   VLDL 13 01/30/2015 0833   LDLCALC 99 01/30/2015 0833       Assessment and plan:   Kaitlyn [name] was seen today for dental referral.  Diagnoses and all orders for this visit:  Pain, dental -     Ambulatory referral to Dentistry  Return if symptoms  worsen or fail to improve.      Ambrose Finland, NP-C [facility] [phone] 08/03/2015, 10:25 AM

## 2015-08-03 NOTE — Progress Notes (Signed)
Patient here for a dental referral Patient also complains of having some spotting after her regular cycle

## 2015-10-04 ENCOUNTER — Emergency Department (HOSPITAL_COMMUNITY)
Admission: EM | Admit: 2015-10-04 | Discharge: 2015-10-04 | Disposition: A | Discharge status: Home / Self Care | Payer: Self-pay | Attending: Emergency Medicine | Admitting: Emergency Medicine

## 2015-10-04 ENCOUNTER — Encounter (HOSPITAL_COMMUNITY): Payer: Self-pay

## 2015-10-04 DIAGNOSIS — G8929 Other chronic pain: Secondary | ICD-10-CM | POA: Insufficient documentation

## 2015-10-04 DIAGNOSIS — Z862 Personal history of diseases of the blood and blood-forming organs and certain disorders involving the immune mechanism: Secondary | ICD-10-CM | POA: Insufficient documentation

## 2015-10-04 DIAGNOSIS — Z87891 Personal history of nicotine dependence: Secondary | ICD-10-CM | POA: Insufficient documentation

## 2015-10-04 DIAGNOSIS — Z8679 Personal history of other diseases of the circulatory system: Secondary | ICD-10-CM | POA: Insufficient documentation

## 2015-10-04 DIAGNOSIS — K644 Residual hemorrhoidal skin tags: Secondary | ICD-10-CM | POA: Insufficient documentation

## 2015-10-04 MED ORDER — HYDROCORTISONE 2.5 % RE CREA: TOPICAL_CREAM | RECTAL | Status: DC

## 2015-10-04 NOTE — ED Notes (Signed)
Pt c/o pain and swelling at rectum x 10 days. Unable to sit

## 2015-10-04 NOTE — ED Notes (Signed)
Pt verbalizes understanding of instructions. 

## 2015-10-04 NOTE — ED Provider Notes (Signed)
CSN: [id number]     Arrival date & time 10/04/15  0836 History   First MD Initiated Contact with Patient 10/04/15 [phone]     Chief Complaint  Patient presents with  . Rectal Pain     (Consider location/radiation/quality/duration/timing/severity/associated sxs/prior Treatment) HPI Comments: Patient resents with complaint of rectal pain for the past 10 days. Patient has had swelling around the rectum has become worse. Prior to that the pain beginning, patient did note some blood in her stool. She is uncertain if she's had hemorrhoids in the past. No fever, nausea or vomiting. No difficulty with urination. She has been using topical preparation H without relief. No history of diabetes or immunocompromise. The onset of this condition was acute. The course is constant. Aggravating factors: none. Alleviating factors: none.    The history is provided by the patient.    Past Medical History  Diagnosis Date  . Bipolar disorder (HCC)   . Anemia   . Endometriosis     ENDOMETRIOSIS???  . Pseudoseizures   . Chronic chest pain    Past Surgical History  Procedure Laterality Date  . Cesarean section    . Pelvic laparoscopy    . Cesarean section classical     Family History  Problem Relation Age of Onset  . Diabetes Mother   . Hypertension Mother   . Heart disease Mother    Social History  Substance Use Topics  . Smoking status: Former Smoker    Quit date: 01/06/2011  . Smokeless tobacco: None  . Alcohol Use: No   OB History    Gravida Para Term Preterm AB TAB SAB Ectopic Multiple Living   Review of Systems  Constitutional: Negative for fever.  HENT: Negative for rhinorrhea and sore throat.   Eyes: Negative for redness.  Respiratory: Negative for cough.   Cardiovascular: Negative for chest pain.  Gastrointestinal: Positive for rectal pain. Negative for nausea, vomiting, abdominal pain, diarrhea and blood in stool.  Genitourinary: Negative for dysuria.   Musculoskeletal: Negative for myalgias.  Skin: Negative for rash.  Neurological: Negative for headaches.    Allergies  Contrast media; Iohexol; Ivp dye; Lamotrigine; and Lamictal  Home Medications   Prior to Admission medications   Medication Sig Start Date End Date Taking? Authorizing Provider  cyclobenzaprine (FLEXERIL) 10 MG tablet Take 10 mg by mouth 3 (three) times daily as needed for muscle spasms. Reported on 08/03/2015    Historical Provider, MD  diphenhydrAMINE (BENADRYL) 25 MG tablet Take 1 tablet (25 mg total) by mouth every 6 (six) hours as needed for itching (Rash). Patient not taking: Reported on 07/02/2015 05/28/14   Arthor Captain, PA-C  meloxicam (MOBIC) 15 MG tablet Take 15 mg by mouth as needed. Reported on 08/03/2015    Historical Provider, MD  metroNIDAZOLE (FLAGYL) 500 MG tablet Take 1 tablet (500 mg total) by mouth 2 (two) times daily. Patient not taking: Reported on 08/03/2015 07/02/15   Ok Edwards, MD   BP 141/105 mmHg  Pulse 73  Resp 16  SpO2 100%  LMP 08/16/2015   Physical Exam  Constitutional: She appears well-developed and well-nourished.  HENT:  Head: Normocephalic and atraumatic.  Eyes: Conjunctivae are normal.  Neck: Normal range of motion. Neck supple.  Pulmonary/Chest: No respiratory distress.  Genitourinary:  Nonthrombosed tender external hemorrhoid noted. No surrounding erythema, perirectal abscess, or cellulitis noted. No drainage.  Neurological: She is alert.  Skin: Skin  is warm and dry.  Psychiatric: She has a normal mood and affect.  Nursing note and vitals reviewed.   ED Course  Procedures (including critical care time) Labs Review Labs Reviewed - No data to display  Imaging Review No results found. I have personally reviewed and evaluated these images and lab results as part of my medical decision-making.   EKG Interpretation None       8:59 AM Patient seen and examined.   Vital signs reviewed and are as  follows: BP 141/105 mmHg  Pulse 73  Resp 16  SpO2 100%  LMP 08/16/2015  10:58 AM Patient examined with nurse chaperone Marylene Land(Angela).   Patient counseled on Anusol cream as well as needing to maintain high-fiber diet. Discussed use of sitz baths.  Return with worsening pain, fever, new symptoms or other concerns.  MDM   Final diagnoses:  External hemorrhoids without complication   Patient with tender nonthrombosed external hemorrhoid. No infection noted or suspected. We also discussed borderline high blood pressure and patient should monitor this and have this rechecked. No history of hypertension.    Renne CriglerJoshua Tyreece Gelles, PA-C 10/04/15 1059  Glynn OctaveStephen Rancour, MD 10/04/15 [phone]

## 2015-10-04 NOTE — Discharge Instructions (Signed)
Please read and follow all provided instructions.  Your diagnoses today include:  1. External hemorrhoids without complication     Tests performed today include:  Vital signs. See below for your results today.   Medications prescribed:   Anusol suppository - steroid medication for hemorrhoid   Home care instructions:  Follow any educational materials contained in this packet.  Follow-up instructions: Please follow-up with your primary care provider as needed for further evaluation of your symptoms. Perform sitz baths as we discussed.   Return instructions:   Please return to the Emergency Department if you experience worsening symptoms.   Please return if you have any other emergent concerns.  Additional Information:  Your vital signs today were: BP 138/74 mmHg   Pulse 63   SpO2 100%   LMP 08/16/2015 If your blood pressure (BP) was elevated above 135/85 this visit, please have this repeated by your doctor within one month. ---------------

## 2015-10-05 ENCOUNTER — Other Ambulatory Visit: Payer: Self-pay

## 2015-10-05 ENCOUNTER — Ambulatory Visit: Payer: Self-pay | Admitting: Gynecology

## 2015-10-08 ENCOUNTER — Encounter: Payer: Self-pay | Admitting: Internal Medicine

## 2015-10-08 ENCOUNTER — Ambulatory Visit: Payer: Self-pay | Attending: Internal Medicine | Admitting: Internal Medicine

## 2015-10-08 ENCOUNTER — Encounter: Payer: Self-pay | Admitting: Clinical

## 2015-10-08 VITALS — BP 114/79 | HR 80 | Temp 98.6°F | Resp 16 | Ht 60.0 in | Wt 257.0 lb

## 2015-10-08 DIAGNOSIS — Z87891 Personal history of nicotine dependence: Secondary | ICD-10-CM | POA: Insufficient documentation

## 2015-10-08 DIAGNOSIS — F319 Bipolar disorder, unspecified: Secondary | ICD-10-CM | POA: Insufficient documentation

## 2015-10-08 DIAGNOSIS — Z79899 Other long term (current) drug therapy: Secondary | ICD-10-CM | POA: Insufficient documentation

## 2015-10-08 DIAGNOSIS — K644 Residual hemorrhoidal skin tags: Secondary | ICD-10-CM | POA: Insufficient documentation

## 2015-10-08 DIAGNOSIS — Z888 Allergy status to other drugs, medicaments and biological substances status: Secondary | ICD-10-CM | POA: Insufficient documentation

## 2015-10-08 DIAGNOSIS — K648 Other hemorrhoids: Secondary | ICD-10-CM

## 2015-10-08 MED ORDER — HYDROCORTISONE ACE-PRAMOXINE 2.5-1 % RE CREA: 1.0000 "application " | TOPICAL_CREAM | Freq: Three times a day (TID) | RECTAL | Status: DC

## 2015-10-08 NOTE — Progress Notes (Signed)
Patient ID: Kaitlyn [name], female   DOB: [birth year], 30 y.o.   MRN: [id number]  CC: hemorrhoids   HPI: Kaitlyn [name] is a 30 y.o. female here today for a follow up visit.  Patient has past medical history of bipolar disorder and anemia. Patient reports that she was seen by the ER last week for pain and swelling around her rectum and was diagnosed with a large external hemorrhoid. She was given Anusol cream but states that the medication was too expensive so she has not purchased it. She has tried OTC preparation H without relief. She is here to find out if there is something cheaper that she may use.   Allergies  Allergen Reactions  . Contrast Media [Iodinated Diagnostic Agents] Hives and Itching  . Iohexol      Code: HIVES, Desc: nausea/vomiting-itching/hives/rash after mri contrast multihance-benedryl was given by radiologist-then pt had seizure-pt was monitored and released to family's care, Onset Date: [id number]   . Ivp Dye [Iodinated Diagnostic Agents] Hives and Itching  . Lamotrigine Hives  . Lamictal [Lamotrigine] Rash   Past Medical History  Diagnosis Date  . Bipolar disorder (HCC)   . Anemia   . Endometriosis     ENDOMETRIOSIS???  . Pseudoseizures   . Chronic chest pain    Current Outpatient Prescriptions on File Prior to Visit  Medication Sig Dispense Refill  . HYDROcodone-acetaminophen (NORCO/VICODIN) 5-325 MG tablet Take 1 tablet by mouth every 4 (four) hours as needed. Reported on 10/08/2015  0  . hydrocortisone (ANUSOL-HC) 2.5 % rectal cream Apply rectally 2 times daily (Patient not taking: Reported on 10/08/2015) 28 g 0  . naproxen sodium (ANAPROX) 220 MG tablet Take 880 mg by mouth once. Reported on 10/08/2015     No current facility-administered medications on file prior to visit.   Family History  Problem Relation Age of Onset  . Diabetes Mother   . Hypertension Mother   . Heart disease Mother    Social History   Social History  . Marital Status: Married   Spouse Name: N/A  . Number of Children: N/A  . Years of Education: N/A   Occupational History  . Not on file.   Social History Main Topics  . Smoking status: Former Smoker    Quit date: 01/06/2011  . Smokeless tobacco: Not on file  . Alcohol Use: No  . Drug Use: No  . Sexual Activity: Yes    Birth Control/ Protection: Condom, None     Comment: inercourse age 70 sexual partners less than 5   Other Topics Concern  . Not on file   Social History Narrative   ** Merged History Encounter **        Review of Systems: Other than what is stated in HPI, all other systems are negative.   Objective:   Filed Vitals:   10/08/15 1151  BP: 114/79  Pulse: 80  Temp: 98.6 F (37 C)  Resp: 16    Physical Exam  Constitutional: [name], place, and time.  Neurological: She is alert and oriented to person, place, and time.  Skin: Skin is warm and dry.  Psychiatric: She has a normal mood and affect.     Lab Results  Component Value Date   WBC 4.8 04/27/2015   HGB 11.0* 04/27/2015   HCT 33.9* 04/27/2015   MCV 84.3 04/27/2015   PLT 256 04/27/2015   Lab Results  Component Value Date   CREATININE 0.61 01/30/2015   BUN 12  01/30/2015   NA 139 01/30/2015   K 4.0 01/30/2015   CL 106 01/30/2015   CO2 25 01/30/2015    Lab Results  Component Value Date   HGBA1C 5.2 12/19/2013   Lipid Panel     Component Value Date/Time   CHOL 150 01/30/2015 0833   TRIG 64 01/30/2015 0833   HDL 38* 01/30/2015 0833   CHOLHDL 3.9 01/30/2015 0833   VLDL 13 01/30/2015 0833   LDLCALC 99 01/30/2015 0833       Assessment and plan:   Luiz Ochoalyshia was seen today for hospitalization follow-up and hemorrhoids.  Diagnoses and all orders for this visit:  External hemorrhoids -     hydrocortisone-pramoxine (ANALPRAM-HC) 2.5-1 % rectal cream; Place 1 application rectally 3 (three) times daily. I have switched her to Analpram which she may [facility]. I have  also advised patient to use Sitz baths and explained how to use it.      Return if symptoms worsen or fail to improve.   Ambrose FinlandValerie A Darcell Yacoub, NP-C [facility] [phone] 10/08/2015, 12:12 PM

## 2015-10-08 NOTE — Progress Notes (Signed)
Patient's here for hospital f/up for external hemorrhoids.  Patient states she's still having pain, rated 5/10.  Patient states she feels all right.

## 2015-10-08 NOTE — Progress Notes (Signed)
[facility] 10/08/2015 10/08/2015 08/03/2015 01/06/2015 09/26/2014  Decreased Interest 0 0 0 0 0  Down, Depressed, Hopeless 0 0 0 0 0  PHQ - 2 Score 0 0 0 0 0    GAD 7 : Generalized Anxiety Score 10/08/2015 08/03/2015  Nervous, Anxious, on Edge 0 0  Control/stop worrying 0 0  Worry too much - different things 1 0  Trouble relaxing 0 0  Restless 0 0  Easily annoyed or irritable 1 0  Afraid - awful might happen 1 0  Total GAD 7 Score 3 0

## 2015-10-08 NOTE — Patient Instructions (Signed)
Sitz bath

## 2015-11-09 ENCOUNTER — Encounter: Payer: Self-pay | Admitting: Gynecology

## 2015-11-09 ENCOUNTER — Ambulatory Visit (INDEPENDENT_AMBULATORY_CARE_PROVIDER_SITE_OTHER): Payer: Self-pay | Admitting: Gynecology

## 2015-11-09 ENCOUNTER — Ambulatory Visit (INDEPENDENT_AMBULATORY_CARE_PROVIDER_SITE_OTHER): Payer: Self-pay

## 2015-11-09 ENCOUNTER — Other Ambulatory Visit: Payer: Self-pay | Admitting: Gynecology

## 2015-11-09 VITALS — BP 124/86

## 2015-11-09 DIAGNOSIS — N83202 Unspecified ovarian cyst, left side: Secondary | ICD-10-CM

## 2015-11-09 DIAGNOSIS — N838 Other noninflammatory disorders of ovary, fallopian tube and broad ligament: Secondary | ICD-10-CM

## 2015-11-09 DIAGNOSIS — N839 Noninflammatory disorder of ovary, fallopian tube and broad ligament, unspecified: Secondary | ICD-10-CM

## 2015-11-09 DIAGNOSIS — N898 Other specified noninflammatory disorders of vagina: Secondary | ICD-10-CM

## 2015-11-09 DIAGNOSIS — N979 Female infertility, unspecified: Secondary | ICD-10-CM

## 2015-11-09 DIAGNOSIS — E663 Overweight: Secondary | ICD-10-CM

## 2015-11-09 DIAGNOSIS — N809 Endometriosis, unspecified: Secondary | ICD-10-CM

## 2015-11-09 DIAGNOSIS — N920 Excessive and frequent menstruation with regular cycle: Secondary | ICD-10-CM

## 2015-11-09 NOTE — Patient Instructions (Signed)
Ovarian Cyst An ovarian cyst is a fluid-filled sac that forms on an ovary. The ovaries are small organs that produce eggs in women. Various types of cysts can form on the ovaries. Most are not cancerous. Many do not cause problems, and they often go away on their own. Some may cause symptoms and require treatment. Common types of ovarian cysts include:  Functional cysts--These cysts may occur every month during the menstrual cycle. This is normal. The cysts usually go away with the next menstrual cycle if the woman does not get pregnant. Usually, there are no symptoms with a functional cyst.  Endometrioma cysts--These cysts form from the tissue that lines the uterus. They are also called "chocolate cysts" because they become filled with blood that turns brown. This type of cyst can cause pain in the lower abdomen during intercourse and with your menstrual period.  Cystadenoma cysts--This type develops from the cells on the outside of the ovary. These cysts can get very big and cause lower abdomen pain and pain with intercourse. This type of cyst can twist on itself, cut off its blood supply, and cause severe pain. It can also easily rupture and cause a lot of pain.  Dermoid cysts--This type of cyst is sometimes found in both ovaries. These cysts may contain different kinds of body tissue, such as skin, teeth, hair, or cartilage. They usually do not cause symptoms unless they get very big.  Theca lutein cysts--These cysts occur when too much of a certain hormone (human chorionic gonadotropin) is produced and overstimulates the ovaries to produce an egg. This is most common after procedures used to assist with the conception of a baby (in vitro fertilization). CAUSES   Fertility drugs can cause a condition in which multiple large cysts are formed on the ovaries. This is called ovarian hyperstimulation syndrome.  A condition called polycystic ovary syndrome can cause hormonal imbalances that can lead to  nonfunctional ovarian cysts. SIGNS AND SYMPTOMS  Many ovarian cysts do not cause symptoms. If symptoms are present, they may include:  Pelvic pain or pressure.  Pain in the lower abdomen.  Pain during sexual intercourse.  Increasing girth (swelling) of the abdomen.  Abnormal menstrual periods.  Increasing pain with menstrual periods.  Stopping having menstrual periods without being pregnant. DIAGNOSIS  These cysts are commonly found during a routine or annual pelvic exam. Tests may be ordered to find out more about the cyst. These tests may include:  Ultrasound.  X-ray of the pelvis.  CT scan.  MRI.  Blood tests. TREATMENT  Many ovarian cysts go away on their own without treatment. Your health care provider may want to check your cyst regularly for 2-3 months to see if it changes. For women in menopause, it is particularly important to monitor a cyst closely because of the higher rate of ovarian cancer in menopausal women. When treatment is needed, it may include any of the following:  A procedure to drain the cyst (aspiration). This may be done using a long needle and ultrasound. It can also be done through a laparoscopic procedure. This involves using a thin, lighted tube with a tiny camera on the end (laparoscope) inserted through a small incision.  Surgery to remove the whole cyst. This may be done using laparoscopic surgery or an open surgery involving a larger incision in the lower abdomen.  Hormone treatment or birth control pills. These methods are sometimes used to help dissolve a cyst. HOME CARE INSTRUCTIONS   Only take over-the-counter   or prescription medicines as directed by your health care provider.  Follow up with your health care provider as directed.  Get regular pelvic exams and Pap tests. SEEK MEDICAL CARE IF:   Your periods are late, irregular, or painful, or they stop.  Your pelvic pain or abdominal pain does not go away.  Your abdomen becomes  larger or swollen.  You have pressure on your bladder or trouble emptying your bladder completely.  You have pain during sexual intercourse.  You have feelings of fullness, pressure, or discomfort in your stomach.  You lose weight for no apparent reason.  You feel generally ill.  You become constipated.  You lose your appetite.  You develop acne.  You have an increase in body and facial hair.  You are gaining weight, without changing your exercise and eating habits.  You think you are pregnant. SEEK IMMEDIATE MEDICAL CARE IF:   You have increasing abdominal pain.  You feel sick to your stomach (nauseous), and you throw up (vomit).  You develop a fever that comes on suddenly.  You have abdominal pain during a bowel movement.  Your menstrual periods become heavier than usual. MAKE SURE YOU:  Understand these instructions.  Will watch your condition.  Will get help right away if you are not doing well or get worse.   This information is not intended to replace advice given to you by your health care provider. Make sure you discuss any questions you have with your health care provider.   Document Released: 07/04/2005 Document Revised: 07/09/2013 Document Reviewed: 03/11/2013 Elsevier Interactive Patient Education 2016 Elsevier Inc.    Endometriosis Endometriosis is a condition in which the tissue that lines the uterus (endometrium) grows outside of its normal location. The tissue may grow in many locations close to the uterus, but it commonly grows on the ovaries, fallopian tubes, vagina, or bowel. Because the uterus expels, or sheds, its lining every menstrual cycle, there is bleeding wherever the endometrial tissue is located. This can cause pain because blood is irritating to tissues not normally exposed to it.  CAUSES  The cause of endometriosis is not known.  SIGNS AND SYMPTOMS  Often, there are no symptoms. When symptoms are present, they can vary with the  location of the displaced tissue. Various symptoms can occur at different times. Although symptoms occur mainly during a woman's menstrual period, they can also occur midcycle and usually stop with menopause. Some people may go months with no symptoms at all. Symptoms may include:   Back or abdominal pain.   Heavier bleeding during periods.   Pain during intercourse.   Painful bowel movements.   Infertility. DIAGNOSIS  Your health care provider will do a physical exam and ask about your symptoms. Various tests may be done, such as:   Blood tests and urine tests. These are done to help rule out other problems.   Ultrasound. This test is done to look for abnormal tissue.   An X-ray of the lower bowel (barium enema).  Laparoscopy. In this procedure, a thin, lighted tube with a tiny camera on the end (laparoscope) is inserted into your abdomen. This helps your health care provider look for abnormal tissue to confirm the diagnosis. The health care provider may also remove a small piece of tissue (biopsy) from any abnormal tissue found. This tissue sample can then be sent to a lab so it can be looked at under a microscope. TREATMENT  Treatment will vary and may include:     pain. Nonsteroidal anti-inflammatory drugs (NSAIDs) are a type of pain medicine that can help to relieve the pain caused by endometriosis.  Hormonal therapy. When using hormonal therapy, periods are eliminated. This eliminates the monthly exposure to blood by the displaced endometrial tissue.   Surgery. Surgery may sometimes be done to remove the abnormal endometrial tissue. In severe cases, surgery may be done to remove the fallopian tubes, uterus, and ovaries (hysterectomy). HOME CARE INSTRUCTIONS   Take all medicines as directed by your health care provider. Do not take aspirin because it may increase bleeding when you are not on hormonal therapy.   Avoid activities that produce pain, including  sexual activity. SEEK MEDICAL CARE IF:  You have pelvic pain before, after, or during your periods.  You have pelvic pain between periods that gets worse during your period.  You have pelvic pain during or after sex.  You have pelvic pain with bowel movements or urination, especially during your period.  You have problems getting pregnant.  You have a fever. SEEK IMMEDIATE MEDICAL CARE IF:   Your pain is severe and is not responding to pain medicine.   You have severe nausea and vomiting, or you cannot keep foods down.   You have pain that is limited to the right lower part of your abdomen.   You have swelling or increasing pain in your abdomen.   You see blood in your stool.  MAKE SURE YOU:   Understand these instructions.  Will watch your condition.  Will get help right away if you are not doing well or get worse.   This information is not intended to replace advice given to you by your health care provider. Make sure you discuss any questions you have with your health care provider.   Document Released: 07/01/2000 Document Revised: 07/25/2014 Document Reviewed: 03/01/2013 Elsevier Interactive Patient Education [employer] Elsevier Inc.

## 2015-11-09 NOTE — Progress Notes (Addendum)
   Patient is a 30 year old gravida 1 para 1 who was seen in the office in December 2016 and was scheduled for follow-up ultrasound but did not do so until now. Patient with secondary infertility is overweight and past history of endometriosis diagnosed by laparoscopy. Her only living child is by her prior partner. She has reported normal menstrual cycles. She reports past history of normal HSG. Husband has not had a semen analysis. She is asymptomatic today.  Ultrasound today: Uterus measured 10.0 x 5.0 x 4.2 cm with endometrial stripe of 1.9 mm. Right ovary was normal. Left ovary removed tissue seen with positive or tear blood flow thin mass measures 7.7 x 5.5 x 9.1 cm average size 7.4 cm diffuse internal low level echoes homogeneous negative vascular flow highly suspicious for an endometrioma. There was some fluid in the cul-de-sac 49 x 20 mm noted.  Assessment/plan asymptomatic overweight patient with a left ovarian mass highly suspicious for endometrioma with past history of endometriosis. Patient with limited financial means [facility] so they can further coordinate her care since she has no medical insurance. Patient fully agrees with this decision. Literature information on endometriosis was provided as well as on ovarian cyst.

## 2015-11-13 ENCOUNTER — Telehealth: Payer: Self-pay | Admitting: *Deleted

## 2015-11-13 NOTE — Telephone Encounter (Signed)
-----   Message from Ok EdwardsJuan H Fernandez, MD sent at 11/09/2015 10:36 AM EDT ----- [facility] with large left ovarian cyst and no medical insurance

## 2015-11-13 NOTE — Telephone Encounter (Signed)
[facility] they will schedule pt and fax me back with time and date to relay.

## 2015-11-19 NOTE — Telephone Encounter (Signed)
Appt. On 12/18/15 @ 9:00am with Dr.James Debroah LoopArnold  Left message for pt to call.

## 2015-11-20 ENCOUNTER — Telehealth: Payer: Self-pay | Admitting: *Deleted

## 2015-11-20 NOTE — Telephone Encounter (Signed)
LEFT MESSAGE FOR PT TO CALL WITH WHICH PHARMACY SHE WOULD LIKE RX SENT TO.

## 2015-11-20 NOTE — Telephone Encounter (Signed)
Pt has appointment schedule with Dr.Arnold 12/18/15 @ 9:00am  [facility]  left ovarian cyst. C/o late cycle, LMP: 10/14/15, took 2 UPT both negative. This LMP 10/14/15 was noted on OV 11/09/15 as well. Should pt follow up with gyn clinic in June regarding this? Please advise

## 2015-11-20 NOTE — Telephone Encounter (Signed)
If UPT negative call in Provera 10 mg 1 PO qd x 10 days

## 2015-11-20 NOTE — Telephone Encounter (Signed)
Pt informed with the below note. 

## 2015-11-24 MED ORDER — MEDROXYPROGESTERONE ACETATE 10 MG PO TABS: 10.0000 mg | ORAL_TABLET | Freq: Every day | ORAL | Status: DC

## 2015-11-24 NOTE — Telephone Encounter (Signed)
Pt aware, Rx sent. 

## 2015-12-18 ENCOUNTER — Ambulatory Visit (INDEPENDENT_AMBULATORY_CARE_PROVIDER_SITE_OTHER): Payer: Self-pay | Admitting: Obstetrics & Gynecology

## 2015-12-18 ENCOUNTER — Encounter: Payer: Self-pay | Admitting: Obstetrics & Gynecology

## 2015-12-18 VITALS — BP 139/99 | HR 77 | Temp 98.4°F | Ht 59.0 in | Wt 255.8 lb

## 2015-12-18 DIAGNOSIS — N83202 Unspecified ovarian cyst, left side: Secondary | ICD-10-CM

## 2015-12-18 DIAGNOSIS — N809 Endometriosis, unspecified: Secondary | ICD-10-CM

## 2015-12-18 NOTE — Progress Notes (Signed)
Patient ID: Kaitlyn [name], female   DOB: 11/25/1985, 30 y.o.   MRN: [id number]  Chief Complaint  Patient presents with  . Ovarian Cyst    HPI Kaitlyn [name] is a 30 y.o. female.  G1P1001 Patient's last menstrual period was 11/20/2015. Referred from Dr Lily PeerFernandez due to insurance issues, h/o infertility and endometriosis. US showed left pelvic endometrioma.  She was followed by him for secondary infertility and she wishes to conceive. She used no BCM since 2011 while being sexually active without conceiving HPI  Past Medical History  Diagnosis Date  . Bipolar disorder (HCC)   . Anemia   . Endometriosis     ENDOMETRIOSIS???  . Pseudoseizures   . Chronic chest pain     Past Surgical History  Procedure Laterality Date  . Cesarean section    . Pelvic laparoscopy      Family History  Problem Relation Age of Onset  . Diabetes Mother   . Hypertension Mother   . Heart disease Mother     Social History Social History  Substance Use Topics  . Smoking status: Former Smoker    Quit date: 01/06/2011  . Smokeless tobacco: Never Used  . Alcohol Use: No    Allergies  Allergen Reactions  . Contrast Media [Iodinated Diagnostic Agents] Hives and Itching  . Iohexol      Code: HIVES, Desc: nausea/vomiting-itching/hives/rash after mri contrast multihance-benedryl was given by radiologist-then pt had seizure-pt was monitored and released to family's care, Onset Date: [id number]   . Ivp Dye [Iodinated Diagnostic Agents] Hives and Itching  . Lamotrigine Hives  . Lamictal [Lamotrigine] Rash    Current Outpatient Prescriptions  Medication Sig Dispense Refill  . HYDROcodone-acetaminophen (NORCO/VICODIN) 5-325 MG tablet Take 1 tablet by mouth every 4 (four) hours as needed. Reported on 11/09/2015  0  . hydrocortisone-pramoxine (ANALPRAM-HC) 2.5-1 % rectal cream Place 1 application rectally 3 (three) times daily. 30 g 0  . naproxen sodium (ANAPROX) 220 MG tablet Take 880 mg by mouth once.  Reported on 11/09/2015    . medroxyPROGESTERone (PROVERA) 10 MG tablet Take 1 tablet (10 mg total) by mouth daily. (Patient not taking: Reported on 12/18/2015) 10 tablet 0   No current facility-administered medications for this visit.    Review of Systems Review of Systems  Constitutional: Positive for unexpected weight change (weight gain).  Genitourinary: Positive for pelvic pain (occasional mild during last period). Negative for vaginal bleeding, vaginal discharge and menstrual problem (regular cycle every 28-32 days, bleeding for 5 days not heavy).    Blood pressure 139/99, pulse 77, temperature 98.4 F (36.9 C), height 4\' 11"  (1.499 m), weight 255 lb 12.8 oz (116.03 kg), last menstrual period 11/20/2015.  Physical Exam Physical Exam  Constitutional: [name], place, and time. She appears well-developed.  obese  Pulmonary/Chest: Effort normal. No respiratory distress.  Abdominal: Soft. There is no tenderness.  Genitourinary: Vagina normal. No vaginal discharge found.  Minimal tenderness, no masses  Neurological: She is alert and oriented to person, place, and time.  Skin: Skin is warm and dry.  Psychiatric: She has a normal mood and affect. Her behavior is normal.  Vitals reviewed.   Data Reviewed US transvaginal 10/2015 Ultrasound today: Uterus measured 10.0 x 5.0 x 4.2 cm with endometrial stripe of 1.9 mm. Right ovary was normal. Left ovary removed tissue seen with positive or tear blood flow thin mass measures 7.7 x 5.5 x 9.1 cm average size 7.4 cm diffuse internal low level echoes  homogeneous negative vascular flow highly suspicious for an endometrioma. There was some fluid in the cul-de-sac 49 x 20 mm noted.  Assessment    Endometriosis with likely left endometrioma on [facility]   Morbid obesity  Plan    Discussed infertility and endometriosis and possible need for surgery due to probable endometrioma. We do not offer infertility service, will  refer to Dr April Manson.    ARNOLD,JAMES 12/18/2015, 9:46 AM

## 2015-12-18 NOTE — Patient Instructions (Signed)
Endometriosis Endometriosis is a condition in which the tissue that lines the uterus (endometrium) grows outside of its normal location. The tissue may grow in many locations close to the uterus, but it commonly grows on the ovaries, fallopian tubes, vagina, or bowel. Because the uterus expels, or sheds, its lining every menstrual cycle, there is bleeding wherever the endometrial tissue is located. This can cause pain because blood is irritating to tissues not normally exposed to it.  CAUSES  The cause of endometriosis is not known.  SIGNS AND SYMPTOMS  Often, there are no symptoms. When symptoms are present, they can vary with the location of the displaced tissue. Various symptoms can occur at different times. Although symptoms occur mainly during a woman's menstrual period, they can also occur midcycle and usually stop with menopause. Some people may go months with no symptoms at all. Symptoms may include:   Back or abdominal pain.   Heavier bleeding during periods.   Pain during intercourse.   Painful bowel movements.   Infertility. DIAGNOSIS  Your health care provider will do a physical exam and ask about your symptoms. Various tests may be done, such as:   Blood tests and urine tests. These are done to help rule out other problems.   Ultrasound. This test is done to look for abnormal tissue.   An X-ray of the lower bowel (barium enema).  Laparoscopy. In this procedure, a thin, lighted tube with a tiny camera on the end (laparoscope) is inserted into your abdomen. This helps your health care provider look for abnormal tissue to confirm the diagnosis. The health care provider may also remove a small piece of tissue (biopsy) from any abnormal tissue found. This tissue sample can then be sent to a lab so it can be looked at under a microscope. TREATMENT  Treatment will vary and may include:   Medicines to relieve pain. Nonsteroidal anti-inflammatory drugs (NSAIDs) are a type of  pain medicine that can help to relieve the pain caused by endometriosis.  Hormonal therapy. When using hormonal therapy, periods are eliminated. This eliminates the monthly exposure to blood by the displaced endometrial tissue.   Surgery. Surgery may sometimes be done to remove the abnormal endometrial tissue. In severe cases, surgery may be done to remove the fallopian tubes, uterus, and ovaries (hysterectomy). HOME CARE INSTRUCTIONS   Take all medicines as directed by your health care provider. Do not take aspirin because it may increase bleeding when you are not on hormonal therapy.   Avoid activities that produce pain, including sexual activity. SEEK MEDICAL CARE IF:  You have pelvic pain before, after, or during your periods.  You have pelvic pain between periods that gets worse during your period.  You have pelvic pain during or after sex.  You have pelvic pain with bowel movements or urination, especially during your period.  You have problems getting pregnant.  You have a fever. SEEK IMMEDIATE MEDICAL CARE IF:   Your pain is severe and is not responding to pain medicine.   You have severe nausea and vomiting, or you cannot keep foods down.   You have pain that is limited to the right lower part of your abdomen.   You have swelling or increasing pain in your abdomen.   You see blood in your stool.  MAKE SURE YOU:   Understand these instructions.  Will watch your condition.  Will get help right away if you are not doing well or get worse.   This information   is not intended to replace advice given to you by your health care provider. Make sure you discuss any questions you have with your health care provider.   Document Released: 07/01/2000 Document Revised: 07/25/2014 Document Reviewed: 03/01/2013 Elsevier Interactive Patient Education 2016 [employer]  

## 2015-12-27 IMAGING — CT CT HEAD W/O CM
1 series · 16 of 30 positions shown, 20 images · non-contrast
Comparison: Brain MRI 07/22/2008. Head CT 03/08/2008.

CLINICAL DATA: [age] -year-old female status post fall,
syncope, seizure. Initial encounter.

EXAM:
CT HEAD WITHOUT CONTRAST
TECHNIQUE: Contiguous axial images were obtained from the base of the skull
through the vertex without intravenous contrast.

[Series 2: head 5.0 h30s · axial · 0.40mm/px · z∈[-165,-25]mm · 16 of 32 slices shown, 20 images]
[im 2/32  brain]
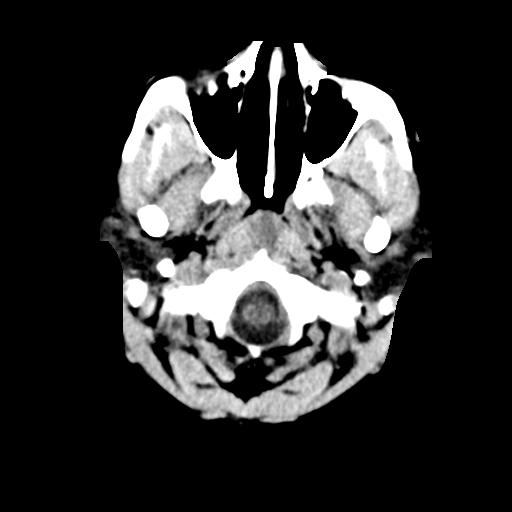
[im 2/32  bone]
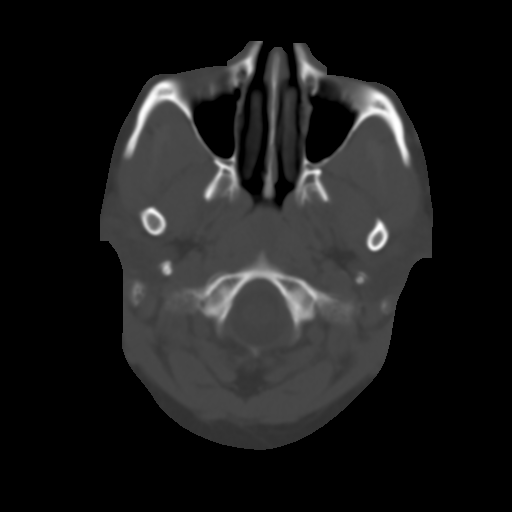
[im 4/32  brain]
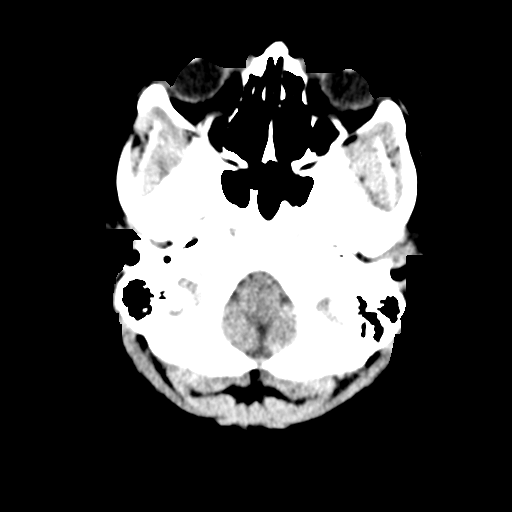
[im 6/32  brain]
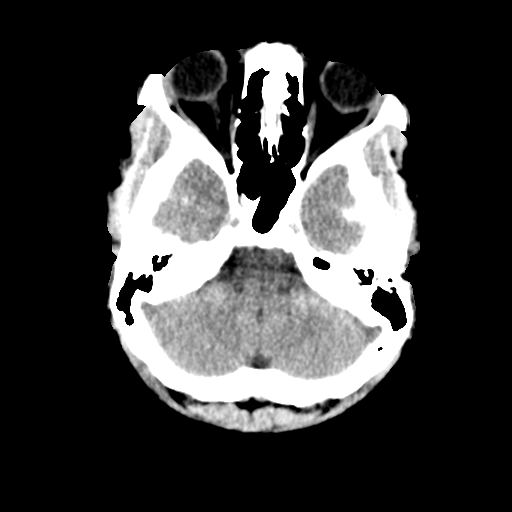
[im 8/32  brain]
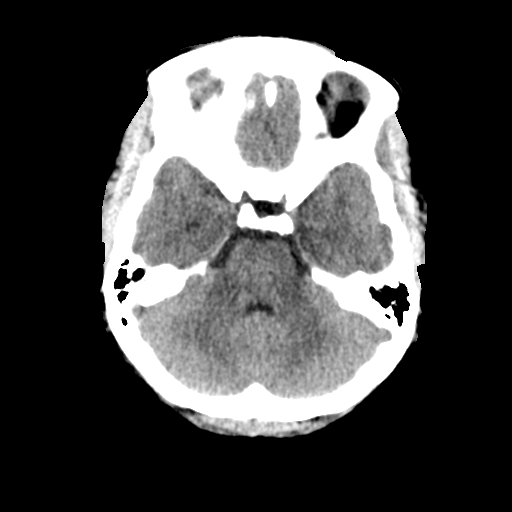
[im 9/32  brain]
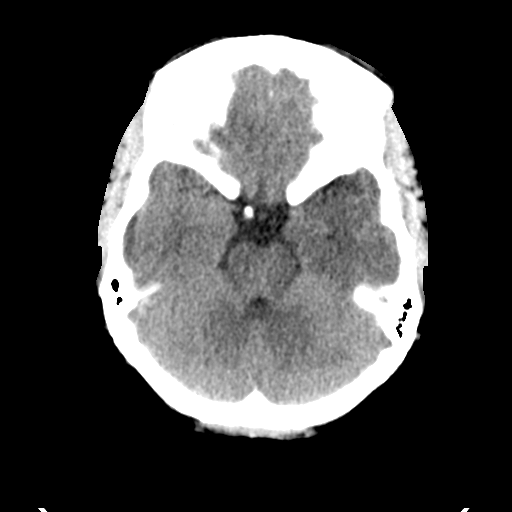
[im 9/32  bone]
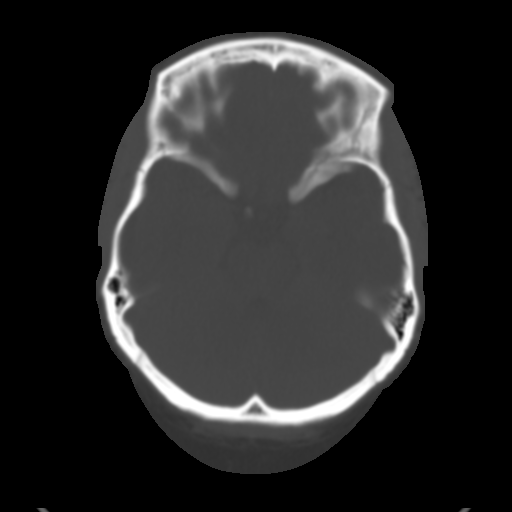
[im 11/32  brain]
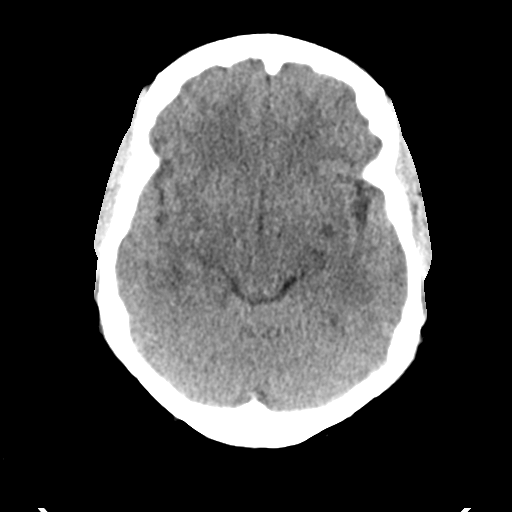
[im 13/32  brain]
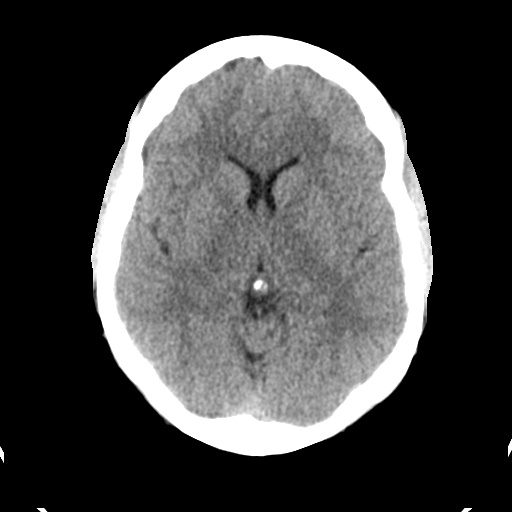
[im 15/32  brain]
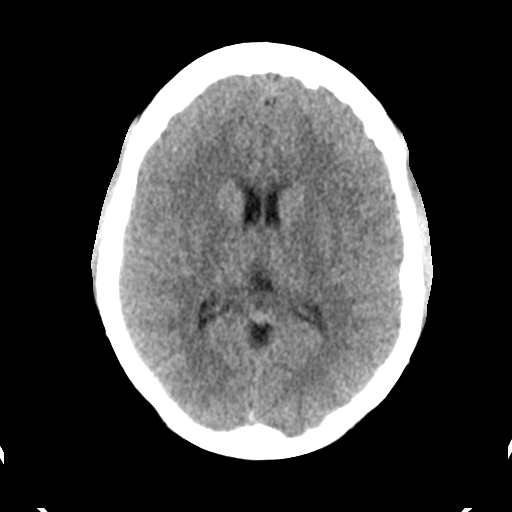
[im 17/32  brain]
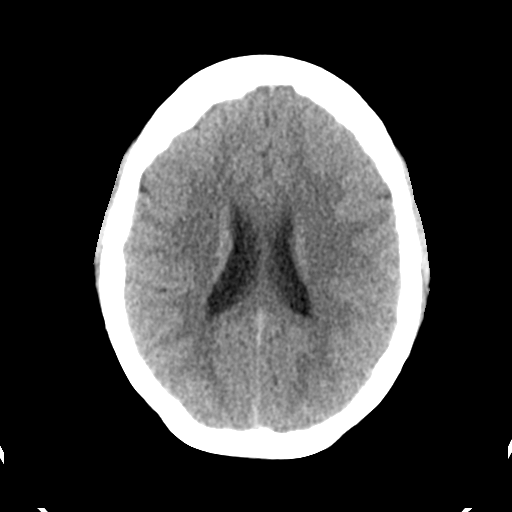
[im 17/32  bone]
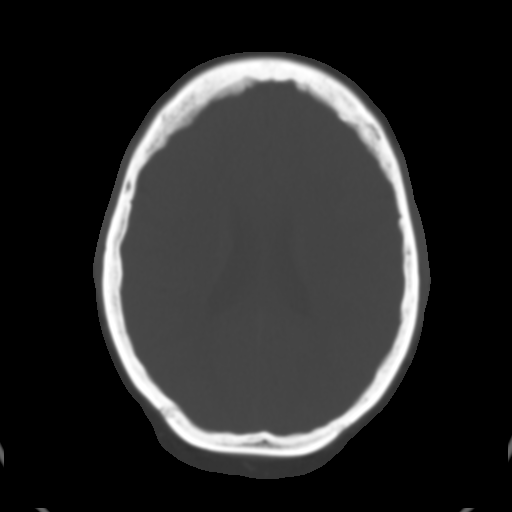
[im 19/32  brain]
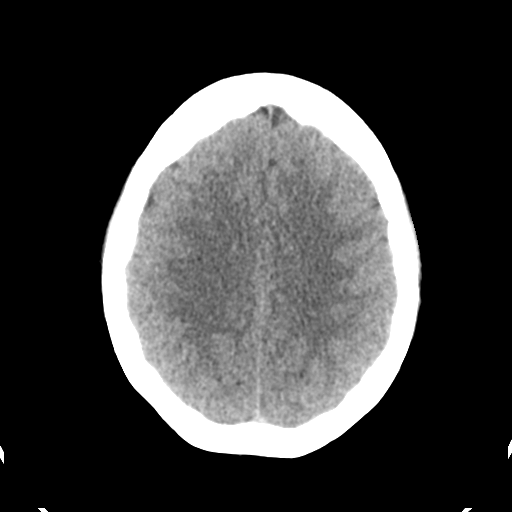
[im 21/32  brain]
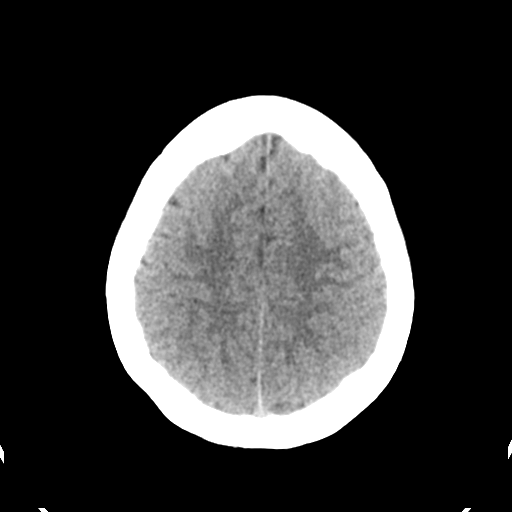
[im 23/32  brain]
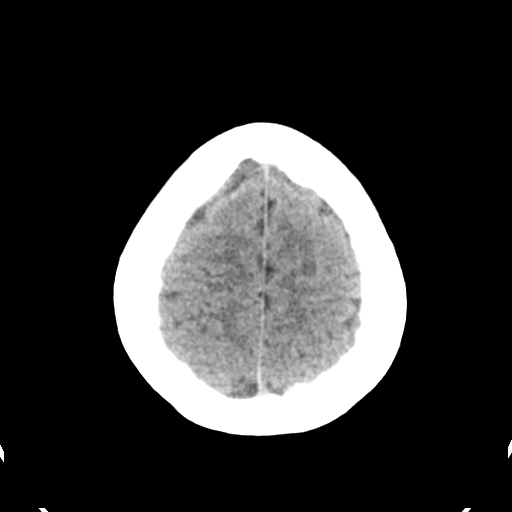
[im 24/32  brain]
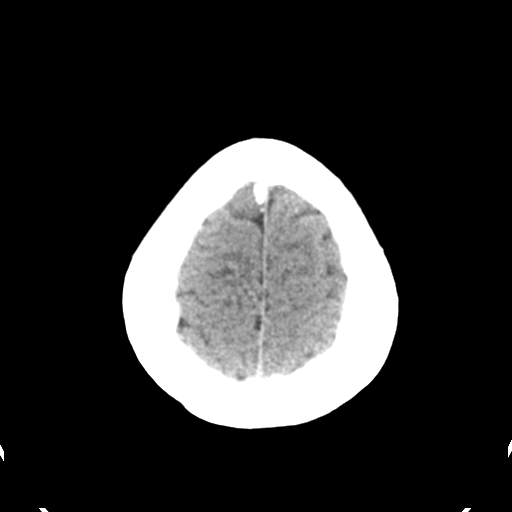
[im 24/32  bone]
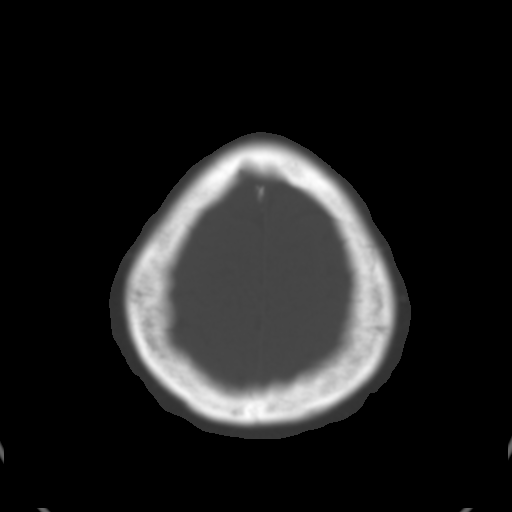
[im 26/32  brain]
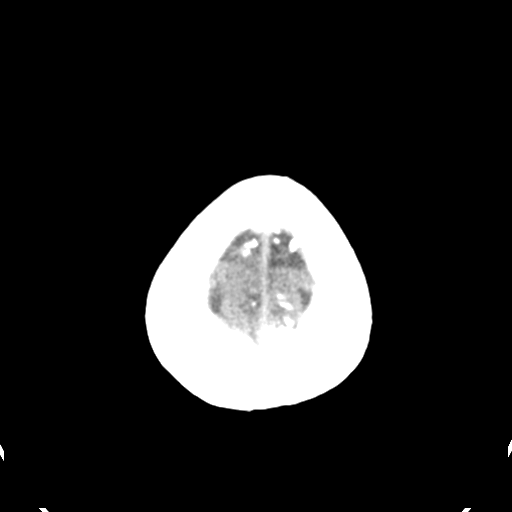
[im 28/32  brain]
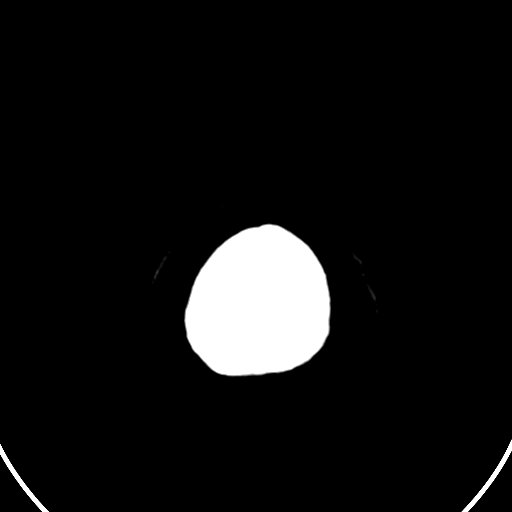
[im 30/32  brain]
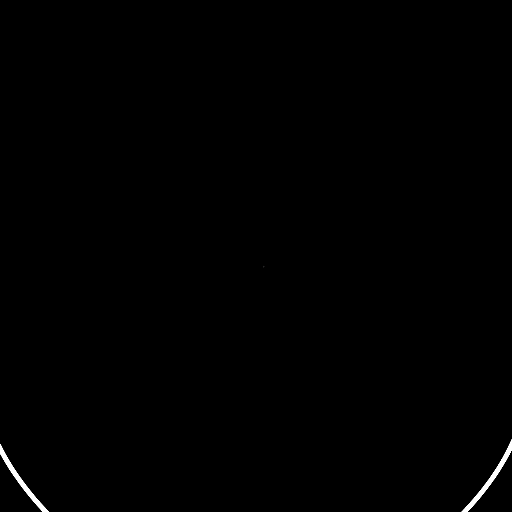

[16 of 30 positions shown; findings below may reference images not displayed]

FINDINGS: Visualized paranasal sinuses and mastoids are clear. No acute
osseous abnormality identified. Mildly dysconjugate gaze, otherwise
negative orbits soft tissues. Visualized scalp soft tissues are
within normal limits.

Stable and normal cerebral volume. No midline shift,
ventriculomegaly, mass effect, evidence of mass lesion, intracranial
hemorrhage or evidence of cortically based acute infarction.
Gray-white matter differentiation is within normal limits throughout
the brain. No suspicious intracranial vascular hyperdensity.
IMPRESSION: Stable and normal noncontrast ct appearance of the brain.

## 2016-01-08 ENCOUNTER — Emergency Department (HOSPITAL_COMMUNITY)
Admission: EM | Admit: 2016-01-08 | Discharge: 2016-01-08 | Disposition: A | Discharge status: Home / Self Care | Payer: Self-pay | Attending: Emergency Medicine | Admitting: Emergency Medicine

## 2016-01-08 ENCOUNTER — Encounter (HOSPITAL_COMMUNITY): Payer: Self-pay

## 2016-01-08 ENCOUNTER — Emergency Department (HOSPITAL_COMMUNITY): Payer: Self-pay

## 2016-01-08 DIAGNOSIS — Z87891 Personal history of nicotine dependence: Secondary | ICD-10-CM | POA: Insufficient documentation

## 2016-01-08 DIAGNOSIS — R112 Nausea with vomiting, unspecified: Secondary | ICD-10-CM | POA: Insufficient documentation

## 2016-01-08 DIAGNOSIS — N858 Other specified noninflammatory disorders of uterus: Secondary | ICD-10-CM | POA: Insufficient documentation

## 2016-01-08 DIAGNOSIS — R1024 Suprapubic pain: Secondary | ICD-10-CM

## 2016-01-08 DIAGNOSIS — N9489 Other specified conditions associated with female genital organs and menstrual cycle: Secondary | ICD-10-CM

## 2016-01-08 DIAGNOSIS — R109 Unspecified abdominal pain: Secondary | ICD-10-CM

## 2016-01-08 DIAGNOSIS — R102 Pelvic and perineal pain: Secondary | ICD-10-CM

## 2016-01-08 DIAGNOSIS — R1013 Epigastric pain: Secondary | ICD-10-CM | POA: Insufficient documentation

## 2016-01-08 DIAGNOSIS — E119 Type 2 diabetes mellitus without complications: Secondary | ICD-10-CM | POA: Insufficient documentation

## 2016-01-08 LAB — URINALYSIS, ROUTINE W REFLEX MICROSCOPIC
Bilirubin Urine: NEGATIVE
Glucose, UA: NEGATIVE mg/dL
Ketones, ur: NEGATIVE mg/dL
NITRITE: NEGATIVE
PH: 7 (ref 5.0–8.0)
Protein, ur: NEGATIVE mg/dL
SPECIFIC GRAVITY, URINE: 1.026 (ref 1.005–1.030)

## 2016-01-08 LAB — WET PREP, GENITAL
Clue Cells Wet Prep HPF POC: NONE SEEN
Sperm: NONE SEEN
Trich, Wet Prep: NONE SEEN
Yeast Wet Prep HPF POC: NONE SEEN

## 2016-01-08 LAB — LIPASE, BLOOD: Lipase: 20 U/L (ref 11–51)

## 2016-01-08 LAB — URINE MICROSCOPIC-ADD ON

## 2016-01-08 LAB — CBC
HEMATOCRIT: 34.5 % — AB (ref 36.0–46.0)
HEMOGLOBIN: 11.1 g/dL — AB (ref 12.0–15.0)
MCH: 26.6 pg (ref 26.0–34.0)
MCHC: 32.2 g/dL (ref 30.0–36.0)
MCV: 82.5 fL (ref 78.0–100.0)
Platelets: 256 10*3/uL (ref 150–400)
RBC: 4.18 MIL/uL (ref 3.87–5.11)
RDW: 14.7 % (ref 11.5–15.5)
WBC: 4.5 10*3/uL (ref 4.0–10.5)

## 2016-01-08 LAB — COMPREHENSIVE METABOLIC PANEL
ALBUMIN: 3.3 g/dL — AB (ref 3.5–5.0)
ALT: 12 U/L — ABNORMAL LOW (ref 14–54)
ANION GAP: 6 (ref 5–15)
AST: 19 U/L (ref 15–41)
Alkaline Phosphatase: 74 U/L (ref 38–126)
BILIRUBIN TOTAL: 0.1 mg/dL — AB (ref 0.3–1.2)
BUN: 7 mg/dL (ref 6–20)
CO2: 25 mmol/L (ref 22–32)
Calcium: 9 mg/dL (ref 8.9–10.3)
Chloride: 107 mmol/L (ref 101–111)
Creatinine, Ser: 0.8 mg/dL (ref 0.44–1.00)
GLUCOSE: 105 mg/dL — AB (ref 65–99)
POTASSIUM: 3.6 mmol/L (ref 3.5–5.1)
Sodium: 138 mmol/L (ref 135–145)
TOTAL PROTEIN: 7.1 g/dL (ref 6.5–8.1)

## 2016-01-08 LAB — I-STAT BETA HCG BLOOD, ED (MC, WL, AP ONLY)

## 2016-01-08 MED ORDER — ONDANSETRON HCL 4 MG/2ML IJ SOLN
4.0000 mg | Freq: Once | INTRAMUSCULAR | Status: AC | PRN
  Administered 2016-01-08: 4 mg via INTRAVENOUS
  Filled 2016-01-08: qty 2

## 2016-01-08 MED ORDER — FENTANYL CITRATE (PF) 100 MCG/2ML IJ SOLN: 50.0000 ug | Freq: Once | INTRAMUSCULAR | Status: DC

## 2016-01-08 MED ORDER — FENTANYL CITRATE (PF) 100 MCG/2ML IJ SOLN
50.0000 ug | INTRAMUSCULAR | Status: DC | PRN
  Administered 2016-01-08: 50 ug via INTRAVENOUS
  Filled 2016-01-08: qty 2

## 2016-01-08 MED ORDER — MORPHINE SULFATE (PF) 4 MG/ML IV SOLN
4.0000 mg | Freq: Once | INTRAVENOUS | Status: AC
  Administered 2016-01-08: 4 mg via INTRAMUSCULAR
  Filled 2016-01-08: qty 1

## 2016-01-08 NOTE — ED Provider Notes (Signed)
CSN: [id number]     Arrival date & time 01/08/16  1029 History   First MD Initiated Contact with Patient 01/08/16 1053     Chief Complaint  Patient presents with  . Abdominal Pain     (Consider location/radiation/quality/duration/timing/severity/associated sxs/prior Treatment) HPI   Patient is a 30 year old female with a history of endometriosis, DM who presents the ED with suprapubic pain that she woke this morning. Pain is constant, sharp, 10/10, nonradiating, worse after a bowel movement with associated to episodes of emesis. Patient states after a bowel movement the pain got worse and she still had the feeling that she needed to have a bowel movement. No nausea. Patient denies any other associated symptoms. Patient denies vaginal discharge, hematuria, dysuria, hematochezia, melena, chest pain, shortness of breath, hematemesis, fever, chills. She's never experienced this type of pain before. LMP ended 2 days ago and was a normal period for her.   Past Medical History  Diagnosis Date  . Bipolar disorder (HCC)   . Anemia   . Endometriosis     ENDOMETRIOSIS???  . Pseudoseizures   . Chronic chest pain    Past Surgical History  Procedure Laterality Date  . Cesarean section    . Pelvic laparoscopy     Family History  Problem Relation Age of Onset  . Diabetes Mother   . Hypertension Mother   . Heart disease Mother    Social History  Substance Use Topics  . Smoking status: Former Smoker    Quit date: 01/06/2011  . Smokeless tobacco: Never Used  . Alcohol Use: No   OB History    Gravida Para Term Preterm AB TAB SAB Ectopic Multiple Living   1 1 1       1      Review of Systems  Constitutional: Negative for fever and chills.  HENT: Negative for trouble swallowing.   Respiratory: Negative for shortness of breath.   Cardiovascular: Negative for chest pain.  Gastrointestinal: Positive for vomiting and abdominal pain. Negative for nausea, diarrhea and blood in stool.   Genitourinary: Negative for dysuria, hematuria, flank pain, vaginal bleeding and vaginal discharge.  Musculoskeletal: Negative for back pain and neck pain.  Skin: Negative for rash.  Neurological: Negative for dizziness, syncope, weakness and headaches.      Allergies  Contrast media; Ivp dye; Iohexol; and Lamictal  Home Medications   Prior to Admission medications   Medication Sig Start Date End Date Taking? Authorizing Provider  acetaminophen (TYLENOL) 500 MG tablet Take 1,000 mg by mouth every 6 (six) hours as needed for moderate pain.   Yes Historical Provider, MD  hydrocortisone-pramoxine [facility](ANALPRAM-HC) 2.5-1 % rectal cream Place 1 application rectally 3 (three) times daily. 10/08/15   Ambrose FinlandValerie A Keck, NP  medroxyPROGESTERone (PROVERA) 10 MG tablet Take 1 tablet (10 mg total) by mouth daily. Patient not taking: Reported on 12/18/2015 11/24/15   Ok EdwardsJuan H Fernandez, MD   BP 123/87 mmHg  Pulse 86  Temp(Src) 98.5 F (36.9 C) (Oral)  Resp 24  Ht 4\' 11"  (1.499 m)  Wt 113.399 kg  BMI 50.47 kg/m2  SpO2 100%  LMP 01/03/2016 Physical Exam  Constitutional: She appears well-developed and well-nourished. No distress.  HENT:  Head: Normocephalic and atraumatic.  Eyes: Conjunctivae are normal.  Neck: Normal range of motion.  Cardiovascular: Normal rate, regular rhythm and normal heart sounds.  Exam reveals no gallop and no friction rub.   No murmur heard. Pulmonary/Chest: Effort normal and breath sounds normal. No respiratory distress. She  has no wheezes. She has no rales.  Abdominal: There is tenderness in the epigastric area. There is no rigidity, no rebound, no guarding, no CVA tenderness, no tenderness at McBurney's point and negative Murphy's sign.  Pain reproduced in the suprapubic region with palpation of RUQ, LUQ,RLQ and LLQ. No pain in those regions.  Genitourinary:  Exam performed by Jerre SimonJessica L Kajuan Guyton,  exam chaperoned Date: 01/08/2016 Pelvic exam: normal external genitalia without  evidence of trauma. VULVA: normal appearing vulva with no masses, tenderness or lesion. VAGINA: normal appearing vagina with normal color and discharge, no lesions. CERVIX: normal appearing cervix without lesions, cervical motion tenderness present, cervical os closed with out purulent discharge; vaginal discharge - bloody, malodorous and scant, Wet prep and DNA probe for chlamydia and GC obtained.   ADNEXA: normal adnexa in size, nontender and no masses appreciated UTERUS: uterus is normal size, firm, shape, consistency with tenderness.    Musculoskeletal: Normal range of motion.  Neurological: She is alert. Coordination normal.  Skin: Skin is warm and dry. No rash noted. She is not diaphoretic.  Psychiatric: She has a normal mood and affect. Her behavior is normal.  Nursing note and vitals reviewed.   ED Course  Procedures (including critical care time)  Labs Review Labs Reviewed  WET PREP, GENITAL - Abnormal; Notable for the following:    WBC, Wet Prep HPF POC FEW (*)    All other components within normal limits  COMPREHENSIVE METABOLIC PANEL - Abnormal; Notable for the following:    Glucose, Bld 105 (*)    Albumin 3.3 (*)    ALT 12 (*)    Total Bilirubin 0.1 (*)    All other components within normal limits  CBC - Abnormal; Notable for the following:    Hemoglobin 11.1 (*)    HCT 34.5 (*)    All other components within normal limits  URINALYSIS, ROUTINE W REFLEX MICROSCOPIC (NOT AT [facility]) - Abnormal; Notable for the following:    APPearance CLOUDY (*)    Hgb urine dipstick LARGE (*)    Leukocytes, UA SMALL (*)    All other components within normal limits  URINE MICROSCOPIC-ADD ON - Abnormal; Notable for the following:    Squamous Epithelial / LPF 6-30 (*)    Bacteria, UA RARE (*)    All other components within normal limits  LIPASE, BLOOD  I-STAT BETA HCG BLOOD, ED ([facility], [facility], [facility] ONLY)  GC/CHLAMYDIA PROBE AMP ([facility]) NOT AT [facility]    Imaging Review [place] Transvaginal  Non-ob  01/08/2016  CLINICAL DATA:  Suprapubic pain, mid pelvic pain, cervical motion tenderness, bleeding, endometriosis EXAM: TRANSABDOMINAL AND TRANSVAGINAL ULTRASOUND OF PELVIS TECHNIQUE: Both transabdominal and transvaginal ultrasound examinations of the pelvis were performed. Transabdominal technique was performed for global imaging of the pelvis including uterus, ovaries, adnexal regions, and pelvic cul-de-sac. It was necessary to proceed with endovaginal exam following the transabdominal exam to visualize the endometrium and ovaries and to characterize a mass visualized on transabdominal imaging. COMPARISON:  None FINDINGS: Uterus Measurements: 8.8 x 3.9 x 4.2 cm. Nabothian cysts at cervix. No uterine mass. Endometrium Thickness: 13 mm thick.  No endometrial fluid or focal abnormality Right ovary Measurements: 2.5 x 2.3 x 1.8 cm. Normal morphology without mass. Internal blood flow present on color Doppler imaging. Left ovary Measurements: No normal appearing LEFT ovary is visualized. In the LEFT adnexa a mass is visualized which measures 7.2 x 4.7 x 7.5 cm in size. This demonstrates homogeneous mildly hypoechoic internal echogenicity  throughout. Blood flow detected within the periphery of the lesion but no internal blood flow. Lesion is highly suspicious for an endometrioma, less likely a hemorrhagic cyst Other findings Moderate complex free pelvic fluid question blood. No other pelvic masses. IMPRESSION: 7.2 x 4.7 x 7.5 cm diameter homogeneous mildly hypoechoic mass in LEFT pelvis most consistent with an endometrioma. Electronically Signed   By: Ulyses Southward M.D.   On: 01/08/2016 15:42   US Pelvis Complete  01/08/2016  CLINICAL DATA:  Suprapubic pain, mid pelvic pain, cervical motion tenderness, bleeding, endometriosis EXAM: TRANSABDOMINAL AND TRANSVAGINAL ULTRASOUND OF PELVIS TECHNIQUE: Both transabdominal and transvaginal ultrasound examinations of the pelvis were performed. Transabdominal technique  was performed for global imaging of the pelvis including uterus, ovaries, adnexal regions, and pelvic cul-de-sac. It was necessary to proceed with endovaginal exam following the transabdominal exam to visualize the endometrium and ovaries and to characterize a mass visualized on transabdominal imaging. COMPARISON:  None FINDINGS: Uterus Measurements: 8.8 x 3.9 x 4.2 cm. Nabothian cysts at cervix. No uterine mass. Endometrium Thickness: 13 mm thick.  No endometrial fluid or focal abnormality Right ovary Measurements: 2.5 x 2.3 x 1.8 cm. Normal morphology without mass. Internal blood flow present on color Doppler imaging. Left ovary Measurements: No normal appearing LEFT ovary is visualized. In the LEFT adnexa a mass is visualized which measures 7.2 x 4.7 x 7.5 cm in size. This demonstrates homogeneous mildly hypoechoic internal echogenicity throughout. Blood flow detected within the periphery of the lesion but no internal blood flow. Lesion is highly suspicious for an endometrioma, less likely a hemorrhagic cyst Other findings Moderate complex free pelvic fluid question blood. No other pelvic masses. IMPRESSION: 7.2 x 4.7 x 7.5 cm diameter homogeneous mildly hypoechoic mass in LEFT pelvis most consistent with an endometrioma. Electronically Signed   By: Ulyses Southward M.D.   On: 01/08/2016 15:42   I have personally reviewed and evaluated these images and lab results as part of my medical decision-making.   EKG Interpretation None      MDM   Final diagnoses:  Adnexal mass  Abdominal pain, unspecified abdominal location  Non-intractable vomiting with nausea, vomiting of unspecified type   Patient is nontoxic, nonseptic appearing, in no apparent distress.  Patient's pain and other symptoms adequately managed in emergency department. Pts pain improved while in the ed with little pain medication.  Labs, imaging and vitals reviewed.  Patient does not meet the SIRS or Sepsis criteria.  On repeat exam patient  does not have a surgical abdomin.  Patient with a known history of left ovarian mass. She has a scheduled surgery this August to have the mass removed. It is likely that this mass has partially ruptured causing bleeding into her pelvic cavity with scant blood seen in the vaginal vault. Vital signs stable and patient stable at time of discharge.  No concerns for hemorrhage.    UA with blood likely from vaginal bleeding and not a UTI. Will send a urine culture.   Patient discharged home with symptomatic treatment and given strict instructions for follow-up with their OB/GYN on Monday.  I have also discussed reasons to return immediately to the ER.  Patient expresses understanding and agrees with plan.  Case discussed with Dr. Madilyn Hook who reviewed the ultrasound and agrees with the above plan.     Jerre Simon, PA 01/08/16 1630  Tilden Fossa, MD 01/09/16 [id number]

## 2016-01-08 NOTE — ED Notes (Signed)
Pelvic cart at bedside. 

## 2016-01-08 NOTE — ED Notes (Signed)
Per Patient, Pt started to have lower bilateral abdomen pain starting about an hour ago. Describes burning pain. Denies vaginal discharge, change in urination, or bleeding.

## 2016-01-08 NOTE — Discharge Instructions (Signed)
You were seen today for pelvic pain. Your ultrasound revealed a mass on your left ovary. Call your OB/GYN today to schedule an appointment to be seen on Monday. Use ibuprofen/Advil as needed for pain.  Return immediately to emergency department if you experience worsening abdominal pain, worsening vaginal bleeding, dizziness, weakness, fever or any other unusual symptoms.

## 2016-01-08 NOTE — ED Notes (Signed)
Pt ambulates independently and with steady gait at time of discharge. Discharge instructions and follow up information reviewed with patient. No other questions or concerns voiced at this time.  

## 2016-01-10 LAB — URINE CULTURE

## 2016-01-11 LAB — GC/CHLAMYDIA PROBE AMP (~~LOC~~) NOT AT ARMC
Chlamydia: NEGATIVE
Neisseria Gonorrhea: NEGATIVE

## 2016-03-31 ENCOUNTER — Encounter: Payer: Self-pay | Admitting: Gynecology

## 2016-03-31 ENCOUNTER — Ambulatory Visit (INDEPENDENT_AMBULATORY_CARE_PROVIDER_SITE_OTHER): Payer: Medicaid Other | Admitting: Gynecology

## 2016-03-31 VITALS — BP 128/82 | Ht 59.0 in | Wt 260.0 lb

## 2016-03-31 DIAGNOSIS — Z8742 Personal history of other diseases of the female genital tract: Secondary | ICD-10-CM | POA: Diagnosis not present

## 2016-03-31 DIAGNOSIS — N979 Female infertility, unspecified: Secondary | ICD-10-CM | POA: Diagnosis not present

## 2016-03-31 DIAGNOSIS — Z0000 Encounter for general adult medical examination without abnormal findings: Secondary | ICD-10-CM | POA: Diagnosis not present

## 2016-03-31 DIAGNOSIS — Z01419 Encounter for gynecological examination (general) (routine) without abnormal findings: Secondary | ICD-10-CM

## 2016-03-31 DIAGNOSIS — E669 Obesity, unspecified: Secondary | ICD-10-CM | POA: Diagnosis not present

## 2016-03-31 LAB — COMPREHENSIVE METABOLIC PANEL
ALK PHOS: 69 U/L (ref 33–115)
ALT: 7 U/L (ref 6–29)
AST: 14 U/L (ref 10–30)
Albumin: 3.7 g/dL (ref 3.6–5.1)
BUN: 10 mg/dL (ref 7–25)
CO2: 27 mmol/L (ref 20–31)
CREATININE: 0.73 mg/dL (ref 0.50–1.10)
Calcium: 9 mg/dL (ref 8.6–10.2)
Chloride: 106 mmol/L (ref 98–110)
Glucose, Bld: 96 mg/dL (ref 65–99)
POTASSIUM: 4.1 mmol/L (ref 3.5–5.3)
Sodium: 141 mmol/L (ref 135–146)
TOTAL PROTEIN: 6.9 g/dL (ref 6.1–8.1)
Total Bilirubin: 0.3 mg/dL (ref 0.2–1.2)

## 2016-03-31 LAB — TSH: TSH: 1.18 m[IU]/L

## 2016-03-31 NOTE — Patient Instructions (Signed)
Exercise to Lose Weight Exercise and a healthy diet may help you lose weight. Your doctor may suggest specific exercises. EXERCISE IDEAS AND TIPS  Choose low-cost things you enjoy doing, such as walking, bicycling, or exercising to workout videos.   Take stairs instead of the elevator.   Walk during your lunch break.   Park your car further away from work or school.   Go to a gym or an exercise class.   Start with 5 to 10 minutes of exercise each day. Build up to 30 minutes of exercise 4 to 6 days a week.   Wear shoes with good support and comfortable clothes.   Stretch before and after working out.   Work out until you breathe harder and your heart beats faster.   Drink extra water when you exercise.   Do not do so much that you hurt yourself, feel dizzy, or get very short of breath.  Exercises that burn about 150 calories:  Running 1  miles in 15 minutes.   Playing volleyball for 45 to 60 minutes.   Washing and waxing a car for 45 to 60 minutes.   Playing touch football for 45 minutes.   Walking 1  miles in 35 minutes.   Pushing a stroller 1  miles in 30 minutes.   Playing basketball for 30 minutes.   Raking leaves for 30 minutes.   Bicycling 5 miles in 30 minutes.   Walking 2 miles in 30 minutes.   Dancing for 30 minutes.   Shoveling snow for 15 minutes.   Swimming laps for 20 minutes.   Walking up stairs for 15 minutes.   Bicycling 4 miles in 15 minutes.   Gardening for 30 to 45 minutes.   Jumping rope for 15 minutes.   Washing windows or floors for 45 to 60 minutes.  Document Released: 08/06/2010 Document Revised: 03/16/2011 Document Reviewed: 08/06/2010 [facility] Patient Information 2012 [employer], [place].Influenza Virus Vaccine (Flucelvax) What is this medicine? INFLUENZA VIRUS VACCINE (in floo EN zuh VAHY ruhs vak SEEN) helps to reduce the risk of getting influenza also known as the flu. The vaccine only helps protect you against some strains  of the flu. This medicine may be used for other purposes; ask your health care provider or pharmacist if you have questions. What should I tell my health care provider before I take this medicine? They need to know if you have any of these conditions: -bleeding disorder like hemophilia -fever or infection -Guillain-Barre syndrome or other neurological problems -immune system problems -infection with the human immunodeficiency virus (HIV) or AIDS -low blood platelet counts -multiple sclerosis -an unusual or allergic reaction to influenza virus vaccine, other medicines, foods, dyes or preservatives -pregnant or trying to get pregnant -breast-feeding How should I use this medicine? This vaccine is for injection into a muscle. It is given by a health care professional. A copy of Vaccine Information Statements will be given before each vaccination. Read this sheet carefully each time. The sheet may change frequently. Talk to your pediatrician regarding the use of this medicine in children. Special care may be needed. Overdosage: If you think you've taken too much of this medicine contact a poison control center or emergency room at once. Overdosage: If you think you have taken too much of this medicine contact a poison control center or emergency room at once. NOTE: This medicine is only for you. Do not share this medicine with others. What if I miss a dose? This does not apply.  What may interact with this medicine? -chemotherapy or radiation therapy -medicines that lower your immune system like etanercept, anakinra, infliximab, and adalimumab -medicines that treat or prevent blood clots like warfarin -phenytoin -steroid medicines like prednisone or cortisone -theophylline -vaccines This list may not describe all possible interactions. Give your health care provider a list of all the medicines, herbs, non-prescription drugs, or dietary supplements you use. Also tell them if you smoke, drink  alcohol, or use illegal drugs. Some items may interact with your medicine. What should I watch for while using this medicine? Report any side effects that do not go away within 3 days to your doctor or health care professional. Call your health care provider if any unusual symptoms occur within 6 weeks of receiving this vaccine. You may still catch the flu, but the illness is not usually as bad. You cannot get the flu from the vaccine. The vaccine will not protect against colds or other illnesses that may cause fever. The vaccine is needed every year. What side effects may I notice from receiving this medicine? Side effects that you should report to your doctor or health care professional as soon as possible: -allergic reactions like skin rash, itching or hives, swelling of the face, lips, or tongue Side effects that usually do not require medical attention (Report these to your doctor or health care professional if they continue or are bothersome.): -fever -headache -muscle aches and pains -pain, tenderness, redness, or swelling at the injection site -tiredness This list may not describe all possible side effects. Call your doctor for medical advice about side effects. You may report side effects to FDA at 1-800-FDA-1088. Where should I keep my medicine? The vaccine will be given by a health care professional in a clinic, pharmacy, doctor's office, or other health care setting. You will not be given vaccine doses to store at home. NOTE: This sheet is a summary. It may not cover all possible information. If you have questions about this medicine, talk to your doctor, pharmacist, or health care provider.    2016, Elsevier/Gold Standard. (2011-06-15 14:06:47)

## 2016-03-31 NOTE — Progress Notes (Signed)
Kaitlyn [name] [birth year] [id number]   History:    30 y.o.  for annual gyn exam with no complaints today. Patient in August of this year at [facility] had a l robotic left ovarian cystectomy for a 10 cm endometrioma. She also had chromopertubation demonstrating bilateral tubal patency. She will be follow-up with endocrinologist later this year she has not conceived spontaneously. Patient's reporting normal menstrual cycles. Patient with no past history of any abnormal Pap smears reported. Patient declined flu vaccine today.  Patient had 1 child several years ago with a different partner and delivered via C-section.  Past medical history,surgical history, family history and social history were all reviewed and documented in the EPIC chart.  Gynecologic History Patient's last menstrual period was 03/27/2016 (exact date). Contraception: none Last Pap: 2015. Results were: normal Last mammogram: Not indicated. Results were: Not indicated  Obstetric History OB History  Gravida Para Term Preterm AB Living  1 1 1     1   SAB TAB Ectopic Multiple Live Births          1    # Outcome Date GA Lbr Len/2nd Weight Sex Delivery Anes PTL Lv  1 Term     F CS-Unspec   LIV       ROS: A ROS was performed and pertinent positives and negatives are included in the history.  GENERAL: No fevers or chills. HEENT: No change in vision, no earache, sore throat or sinus congestion. NECK: No pain or stiffness. CARDIOVASCULAR: No chest pain or pressure. No palpitations. PULMONARY: No shortness of breath, cough or wheeze. GASTROINTESTINAL: No abdominal pain, nausea, vomiting or diarrhea, melena or bright red blood per rectum. GENITOURINARY: No urinary frequency, urgency, hesitancy or dysuria. MUSCULOSKELETAL: No joint or muscle pain, no back pain, no recent trauma. DERMATOLOGIC: No rash, no itching, no lesions. ENDOCRINE: No polyuria, polydipsia, no heat or cold intolerance. No recent change  in weight. HEMATOLOGICAL: No anemia or easy bruising or bleeding. NEUROLOGIC: No headache, seizures, numbness, tingling or weakness. PSYCHIATRIC: No depression, no loss of interest in normal activity or change in sleep pattern.     Exam: chaperone present  BP 128/82   Ht 4\' 11"  (1.499 m)   Wt 260 lb (117.9 kg)   LMP 03/27/2016 (Exact Date)   BMI 52.51 kg/m   Body mass index is 52.51 kg/m.  General appearance : Well developed well nourished female. No acute distress HEENT: Eyes: no retinal hemorrhage or exudates,  Neck supple, trachea midline, no carotid bruits, no thyroidmegaly Lungs: Clear to auscultation, no rhonchi or wheezes, or rib retractions  Heart: Regular rate and rhythm, no murmurs or gallops Breast:Examined in sitting and supine position were symmetrical in appearance, no palpable masses or tenderness,  no skin retraction, no nipple inversion, no nipple discharge, no skin discoloration, no axillary or supraclavicular lymphadenopathy Abdomen: no palpable masses or tenderness, no rebound or guarding Extremities: no edema or skin discoloration or tenderness  Pelvic:  Bartholin, Urethra, Skene Glands: Within normal limits             Vagina: No gross lesions or discharge  Cervix: No gross lesions or discharge  Uterus  anteverted, normal size, shape and consistency, non-tender and mobile  Adnexa  Without masses or tenderness  Anus and perineum  normal   Rectovaginal  normal sphincter tone without palpated masses or tenderness             Hemoccult not indicated  Assessment/Plan:  30 y.o. female for annual exam doing well since her robotic ovarian cystectomy for endometrioma. Patient to follow-up with the reproductive endocrinologist later this year. Patient was informed the importance of regular exercise and weight gain since she is obese this will help also conceived. Pap smear not indicated this year. Since she has had blood work several times this year the only blood  work indicated is her fasting lipid profile today and TSH. Patient declined flu vaccine.   Ok EdwardsFERNANDEZ,Keidra Withers H MD, 10:15 AM 03/31/2016

## 2016-04-11 ENCOUNTER — Encounter: Payer: Self-pay | Admitting: Gynecology

## 2016-04-11 ENCOUNTER — Ambulatory Visit (INDEPENDENT_AMBULATORY_CARE_PROVIDER_SITE_OTHER): Payer: Medicaid Other | Admitting: Gynecology

## 2016-04-11 VITALS — BP 128/86

## 2016-04-11 DIAGNOSIS — N898 Other specified noninflammatory disorders of vagina: Secondary | ICD-10-CM

## 2016-04-11 DIAGNOSIS — N3000 Acute cystitis without hematuria: Secondary | ICD-10-CM

## 2016-04-11 DIAGNOSIS — R350 Frequency of micturition: Secondary | ICD-10-CM | POA: Diagnosis not present

## 2016-04-11 LAB — URINALYSIS W MICROSCOPIC + REFLEX CULTURE
Bilirubin Urine: NEGATIVE
CASTS: NONE SEEN [LPF]
CRYSTALS: NONE SEEN [HPF]
Glucose, UA: NEGATIVE
HGB URINE DIPSTICK: NEGATIVE
LEUKOCYTES UA: NEGATIVE
Nitrite: NEGATIVE
PH: 6 (ref 5.0–8.0)
RBC / HPF: NONE SEEN RBC/HPF (ref <=2)
Specific Gravity, Urine: 1.02 (ref 1.001–1.035)
WBC, UA: NONE SEEN WBC/HPF (ref <=5)
YEAST: NONE SEEN [HPF]

## 2016-04-11 LAB — WET PREP FOR TRICH, YEAST, CLUE
Clue Cells Wet Prep HPF POC: NONE SEEN
Trich, Wet Prep: NONE SEEN
WBC WET PREP: NONE SEEN
Yeast Wet Prep HPF POC: NONE SEEN

## 2016-04-11 MED ORDER — NITROFURANTOIN MONOHYD MACRO 100 MG PO CAPS: 100.0000 mg | ORAL_CAPSULE | Freq: Two times a day (BID) | ORAL | 0 refills | Status: DC

## 2016-04-11 NOTE — Patient Instructions (Addendum)
Nitrofurantoin tablets or capsules What is this medicine? NITROFURANTOIN (nye troe fyoor AN toyn) is an antibiotic. It is used to treat urinary tract infections. This medicine may be used for other purposes; ask your health care provider or pharmacist if you have questions. What should I tell my health care provider before I take this medicine? They need to know if you have any of these conditions: -anemia -diabetes -glucose-6-phosphate dehydrogenase deficiency -kidney disease -liver disease -lung disease -other chronic illness -an unusual or allergic reaction to nitrofurantoin, other antibiotics, other medicines, foods, dyes or preservatives -pregnant or trying to get pregnant -breast-feeding How should I use this medicine? Take this medicine by mouth with a glass of water. Follow the directions on the prescription label. Take this medicine with food or milk. Take your doses at regular intervals. Do not take your medicine more often than directed. Do not stop taking except on your doctor's advice. Talk to your pediatrician regarding the use of this medicine in children. While this drug may be prescribed for selected conditions, precautions do apply. Overdosage: If you think you have taken too much of this medicine contact a poison control center or emergency room at once. NOTE: This medicine is only for you. Do not share this medicine with others. What if I miss a dose? If you miss a dose, take it as soon as you can. If it is almost time for your next dose, take only that dose. Do not take double or extra doses. What may interact with this medicine? -antacids containing magnesium trisilicate -probenecid -quinolone antibiotics like ciprofloxacin, lomefloxacin, norfloxacin and ofloxacin -sulfinpyrazone This list may not describe all possible interactions. Give your health care provider a list of all the medicines, herbs, non-prescription drugs, or dietary supplements you use. Also tell  them if you smoke, drink alcohol, or use illegal drugs. Some items may interact with your medicine. What should I watch for while using this medicine? Tell your doctor or health care professional if your symptoms do not improve or if you get new symptoms. Drink several glasses of water a day. If you are taking this medicine for a long time, visit your doctor for regular checks on your progress. If you are diabetic, you may get a false positive result for sugar in your urine with certain brands of urine tests. Check with your doctor. What side effects may I notice from receiving this medicine? Side effects that you should report to your doctor or health care professional as soon as possible: -allergic reactions like skin rash or hives, swelling of the face, lips, or tongue -chest pain -cough -difficulty breathing -dizziness, drowsiness -fever or infection -joint aches or pains -pale or blue-tinted skin -redness, blistering, peeling or loosening of the skin, including inside the mouth -tingling, burning, pain, or numbness in hands or feet -unusual bleeding or bruising -unusually weak or tired -yellowing of eyes or skin Side effects that usually do not require medical attention (report to your doctor or health care professional if they continue or are bothersome): -dark urine -diarrhea -headache -loss of appetite -nausea or vomiting -temporary hair loss This list may not describe all possible side effects. Call your doctor for medical advice about side effects. You may report side effects to FDA at 1-800-FDA-1088. Where should I keep my medicine? Keep out of the reach of children. Store at room temperature between 15 and 30 degrees C (59 and 86 degrees F). Protect from light. Throw away any unused medicine after the expiration date. NOTE:   This sheet is a summary. It may not cover all possible information. If you have questions about this medicine, talk to your doctor, pharmacist, or health  care provider.    2016, Elsevier/Gold Standard. (2008-01-23 15:56:47) Urinary Tract Infection Urinary tract infections (UTIs) can develop anywhere along your urinary tract. Your urinary tract is your body's drainage system for removing wastes and extra water. Your urinary tract includes two kidneys, two ureters, a bladder, and a urethra. Your kidneys are a pair of bean-shaped organs. Each kidney is about the size of your fist. They are located below your ribs, one on each side of your spine. CAUSES Infections are caused by microbes, which are microscopic organisms, including fungi, viruses, and bacteria. These organisms are so small that they can only be seen through a microscope. Bacteria are the microbes that most commonly cause UTIs. SYMPTOMS  Symptoms of UTIs may vary by age and gender of the patient and by the location of the infection. Symptoms in young women typically include a frequent and intense urge to urinate and a painful, burning feeling in the bladder or urethra during urination. Older women and men are more likely to be tired, shaky, and weak and have muscle aches and abdominal pain. A fever may mean the infection is in your kidneys. Other symptoms of a kidney infection include pain in your back or sides below the ribs, nausea, and vomiting. DIAGNOSIS To diagnose a UTI, your caregiver will ask you about your symptoms. Your caregiver will also ask you to provide a urine sample. The urine sample will be tested for bacteria and white blood cells. White blood cells are made by your body to help fight infection. TREATMENT  Typically, UTIs can be treated with medication. Because most UTIs are caused by a bacterial infection, they usually can be treated with the use of antibiotics. The choice of antibiotic and length of treatment depend on your symptoms and the type of bacteria causing your infection. HOME CARE INSTRUCTIONS  If you were prescribed antibiotics, take them exactly as your  caregiver instructs you. Finish the medication even if you feel better after you have only taken some of the medication.  Drink enough water and fluids to keep your urine clear or pale yellow.  Avoid caffeine, tea, and carbonated beverages. They tend to irritate your bladder.  Empty your bladder often. Avoid holding urine for long periods of time.  Empty your bladder before and after sexual intercourse.  After a bowel movement, women should cleanse from front to back. Use each tissue only once. SEEK MEDICAL CARE IF:   You have back pain.  You develop a fever.  Your symptoms do not begin to resolve within 3 days. SEEK IMMEDIATE MEDICAL CARE IF:   You have severe back pain or lower abdominal pain.  You develop chills.  You have nausea or vomiting.  You have continued burning or discomfort with urination. MAKE SURE YOU:   Understand these instructions.  Will watch your condition.  Will get help right away if you are not doing well or get worse.   This information is not intended to replace advice given to you by your health care provider. Make sure you discuss any questions you have with your health care provider.   Document Released: 04/13/2005 Document Revised: 03/25/2015 Document Reviewed: 08/12/2011 Elsevier Interactive Patient Education 2016 Elsevier Inc.  

## 2016-04-11 NOTE — Progress Notes (Signed)
HPI: Patient is a 30 year old that presented to the office today complaining of urinary frequency in small amounts does not feel she empties completely and has gone up several times during the night. She has some slight irritation but no true dysuria. She denies any odor in her urine or any vaginal discharge. She denies any fever, chills, nausea or vomiting. She denies any back pain. No GI complaints. Last menstrual period 03/27/2016.   ROS: A ROS was performed and pertinent positives and negatives are included in the history.  GENERAL: No fevers or chills. HEENT: No change in vision, no earache, sore throat or sinus congestion. NECK: No pain or stiffness. CARDIOVASCULAR: No chest pain or pressure. No palpitations. PULMONARY: No shortness of breath, cough or wheeze. GASTROINTESTINAL: No abdominal pain, nausea, vomiting or diarrhea, melena or bright red blood per rectum. GENITOURINARY: Urinary frequency MUSCULOSKELETAL: No joint or muscle pain, no back pain, no recent trauma. DERMATOLOGIC: No rash, no itching, no lesions. ENDOCRINE: No polyuria, polydipsia, no heat or cold intolerance. No recent change in weight. HEMATOLOGICAL: No anemia or easy bruising or bleeding. NEUROLOGIC: No headache, seizures, numbness, tingling or weakness. PSYCHIATRIC: No depression, no loss of interest in normal activity or change in sleep pattern.   PE: Blood pressure 128/86 Gen. appearance well-developed well-nourished female with the above-mentioned complaint Abdomen: Slight suprapubic discomfort Pelvic: Bartholin urethra Skene was within normal limits Vagina: No lesions or discharge Cervix: No lesions or discharge Uterus: Anteverted normal size shape and consistency slight suprapubic discomfort but no rebound or guarding Adnexa: No palpable mass or tenderness Rectal exam not done  Wet prep moderate bacteria  Urinalysis moderate bacteria   Assessment Plan: Signs and symptoms highly suspicious for urinary tract  infection we are going to start her on Macrobid one by mouth twice a day for 7 days. Patient declined flu vaccine.    Greater than 50% of time was spent in counseling and coordinating care of this patient.   Time of consultation: 15   Minutes.

## 2016-04-13 LAB — URINE CULTURE

## 2016-11-30 ENCOUNTER — Encounter: Payer: Self-pay | Admitting: Gynecology

## 2017-01-20 ENCOUNTER — Ambulatory Visit: Payer: Self-pay | Admitting: Physical Therapy

## 2017-02-13 ENCOUNTER — Ambulatory Visit: Payer: Self-pay | Admitting: Physical Therapy

## 2017-04-03 ENCOUNTER — Encounter: Payer: Medicaid Other | Admitting: Gynecology

## 2017-05-10 ENCOUNTER — Ambulatory Visit (INDEPENDENT_AMBULATORY_CARE_PROVIDER_SITE_OTHER): Payer: BLUE CROSS/BLUE SHIELD | Admitting: Gynecology

## 2017-05-10 ENCOUNTER — Encounter: Payer: Self-pay | Admitting: Gynecology

## 2017-05-10 VITALS — BP 130/80 | Ht 60.0 in | Wt 262.0 lb

## 2017-05-10 DIAGNOSIS — Z01419 Encounter for gynecological examination (general) (routine) without abnormal findings: Secondary | ICD-10-CM | POA: Diagnosis not present

## 2017-05-10 NOTE — Addendum Note (Signed)
Addended by: Dayna BarkerGARDNER, Watson Robarge K on: 05/10/2017 03:52 PM   Modules accepted: Orders

## 2017-05-10 NOTE — Progress Notes (Signed)
    Kaitlyn [name] 06/01/1986 [id number]        31 y.o.  G1P1001 for annual gynecologic exam.  Former patient of Dr. Lily PeerFernandez.  Currently seeing Dr. Elesa Hackereaton for infertility evaluation and treatment.  Had a robotic ovarian cystectomy in the past for large endometrioma.  Past medical history,surgical history, problem list, medications, allergies, family history and social history were all reviewed and documented as reviewed in the EPIC chart.  ROS:  Performed with pertinent positives and negatives included in the history, assessment and plan.   Additional significant findings : None   Exam: Kennon PortelaKim Gardner assistant Vitals:   05/10/17 1522  BP: 130/80  Weight: 262 lb (118.8 kg)  Height: 5' (1.524 m)   Body mass index is 51.17 kg/m.  General appearance:  Normal affect, orientation and appearance. Skin: Grossly normal HEENT: Without gross lesions.  No cervical or supraclavicular adenopathy. Thyroid normal.  Lungs:  Clear without wheezing, rales or rhonchi Cardiac: RR, without RMG Abdominal:  Soft, nontender, without masses, guarding, rebound, organomegaly or hernia Breasts:  Examined lying and sitting without masses, retractions, discharge or axillary adenopathy. Pelvic:  Ext, BUS, Vagina: Normal  Cervix: Normal.  Pap smear done  Uterus: Difficult to palpate but no gross masses or tenderness  Adnexa: Without gross masses or tenderness    Anus and perineum: Normal   Rectovaginal: Normal sphincter tone without palpated masses or tenderness.    Assessment/Plan:  31 y.o. 391P1001 female for annual gynecologic exam with regular menses, no contraception.   1. Infertility.  Being evaluated and treated by Dr. Elesa Hackereaton.  On prenatal vitamins.  She will continue to follow-up with him in reference to this. 2. Breast health.  SBE monthly reviewed. 3. Pap smear 2015.  Pap smear done today.  No history of abnormal Pap smears previously. 4. History of axillary boils that come and go.  No active  disease at this time.  Reviewed with her as she is trying to get pregnant now would not treat with any medications but recommend heat and soaks if they occur but otherwise would follow now expectantly. 5. Health maintenance.  Discussed routine blood work.  Patient declines as she plans to pursue pregnancy now she will follow up with her obstetrician for ongoing care and monitoring.  She will follow-up with us after achieving pregnancy and delivery for continuing routine gynecologic care.   [name],TIMOTHY P MD, 3:43 PM 05/10/2017

## 2017-05-10 NOTE — Patient Instructions (Addendum)
Follow-up with an obstetrician for ongoing obstetrical care when you achieve pregnancy.

## 2017-05-16 LAB — PAP IG W/ RFLX HPV ASCU

## 2017-05-16 LAB — HUMAN PAPILLOMAVIRUS, HIGH RISK: HPV DNA HIGH RISK: NOT DETECTED

## 2017-08-31 ENCOUNTER — Ambulatory Visit: Payer: BLUE CROSS/BLUE SHIELD | Admitting: Gynecology

## 2017-08-31 ENCOUNTER — Encounter: Payer: Self-pay | Admitting: Gynecology

## 2017-08-31 VITALS — BP 130/76

## 2017-08-31 DIAGNOSIS — N946 Dysmenorrhea, unspecified: Secondary | ICD-10-CM

## 2017-08-31 DIAGNOSIS — N809 Endometriosis, unspecified: Secondary | ICD-10-CM

## 2017-08-31 MED ORDER — IBUPROFEN 800 MG PO TABS: 800.0000 mg | ORAL_TABLET | Freq: Three times a day (TID) | ORAL | 1 refills | Status: DC | PRN

## 2017-08-31 NOTE — Patient Instructions (Signed)
Follow-up for ultrasound as scheduled.  Consider seeing Dr. April MansonYalcinkaya for ongoing infertility management

## 2017-08-31 NOTE — Progress Notes (Signed)
    Kaitlyn [name] 08/30/1985 [id number]        32 y.o.  G1P1001 resents with her husband complaining of 3-6 months of worsening dysmenorrhea.  Also having some fleeting pains in between her menses.  Menses overall are regular and monthly.  History of endometriosis initially diagnosed with laparoscopy.  Subsequently had 7 cm endometrioma removed this past fall [facility].  Has undergone infertility evaluation and treatments but unfortunately has not achieved pregnancy to date.  Past medical history,surgical history, problem list, medications, allergies, family history and social history were all reviewed and documented in the EPIC chart.  Directed ROS with pertinent positives and negatives documented in the history of present illness/assessment and plan.  Exam: Kennon PortelaKim Gardner assistant Vitals:   08/31/17 1234  BP: 130/76   General appearance:  Normal Abdomen without gross masses or tenderness Pelvic external BUS vagina normal.  Cervix normal.  Uterus unable to palpate due to abdominal girth but no gross masses or tenderness.  Adnexa without masses or tenderness.  Assessment/Plan:  32 y.o. G1P1001 with a history of worsening dysmenorrhea and some intermenstrual pain.  Menses otherwise regular.  History of endometriosis to include endometrioma removal this past year.  Primary infertility with failed management to date.  Reviewed probability of endometriosis as a source of her pain.  Recommended starting with ultrasound given the limitations of her physical exam and her history of endometrioma in the past.  Also recommended ibuprofen 800 mg every 8 hours for now for her dysmenorrhea to start early in her cycle.  #60 with 1 refill provided.  We reviewed various treatment options for endometriosis to include oral contraceptives, progesterone only, Depo-Lupron, or Orlissa possible Mirena IUD.  Also discussed that using medications such as these would prohibit pregnancy trials.  I recommended that she  consider following up with Dr. April MansonYalcinkaya for infertility management and otherwise she will follow-up with me for her ultrasound and will go from there.  I also reviewed if small endometriomas are suspected whether this needs to be operated on or not at this point and discussed the options for ongoing observation.  Greater than 50% of my time was spent in direct face to face counseling and coordination of care with the patient.     Kaitlyn Lordsimothy P Glenora Morocho MD, 2:44 PM 08/31/2017

## 2017-09-12 ENCOUNTER — Ambulatory Visit (INDEPENDENT_AMBULATORY_CARE_PROVIDER_SITE_OTHER): Payer: BLUE CROSS/BLUE SHIELD

## 2017-09-12 ENCOUNTER — Other Ambulatory Visit: Payer: Self-pay | Admitting: Gynecology

## 2017-09-12 ENCOUNTER — Ambulatory Visit: Payer: BLUE CROSS/BLUE SHIELD | Admitting: Gynecology

## 2017-09-12 ENCOUNTER — Encounter: Payer: Self-pay | Admitting: Gynecology

## 2017-09-12 VITALS — BP 124/80

## 2017-09-12 DIAGNOSIS — N8311 Corpus luteum cyst of right ovary: Secondary | ICD-10-CM | POA: Diagnosis not present

## 2017-09-12 DIAGNOSIS — N809 Endometriosis, unspecified: Secondary | ICD-10-CM

## 2017-09-12 DIAGNOSIS — N946 Dysmenorrhea, unspecified: Secondary | ICD-10-CM

## 2017-09-12 DIAGNOSIS — Z8742 Personal history of other diseases of the female genital tract: Secondary | ICD-10-CM

## 2017-09-12 NOTE — Patient Instructions (Signed)
Call Dr. Lyndal RainbowYalcinkaya's office to arrange for follow-up in reference to infertility.  If you have any problems arranging this call my office and we can help you get in.

## 2017-09-12 NOTE — Progress Notes (Signed)
    Kaitlyn [name] 12/12/1985 [id number]        32 y.o.  G1P1001 presents for ultrasound.  History of worsening dysmenorrhea and history of endometriosis with large endometrioma in the past.  U has undergone infertility evaluation and treatments but unfortunately has been unsuccessful to date.  Ultrasound was ordered to rule out large endometriomas or other pathology.  Past medical history,surgical history, problem list, medications, allergies, family history and social history were all reviewed and documented in the EPIC chart.  Directed ROS with pertinent positives and negatives documented in the history of present illness/assessment and plan.  Exam: Vitals:   09/12/17 1428  BP: 124/80   General appearance:  Normal  Ultrasound transvaginal shows uterus normal size and echotexture.  Endometrial echo 6.8 mm with faint try layer appearance.  Right ovary with thick walled cyst with irregular lumen and peripheral flow consistent with corpus luteal cyst 29 x 23 x 24 mm.  Small amount of fluid adjacent to the cyst 12 x 4 mm.  Left ovary with small cyst with internal echoes 12 x 13 mm.  Cul-de-sac negative  Assessment/Plan:  32 y.o. G1P1001 with history of infertility and endometriosis and worsening dysmenorrhea.  Ultrasound may show small left ovarian endometrioma although this may also represent a physiologic change.  Does have ovulatory change on the right.  We discussed all of this today.  At this point see no need for aggressive intervention other than she is going to start the 800 mg ibuprofen 3 times daily at the onset of her menses to see if this does not help control her discomfort.  She is going to make a follow-up appointment with Dr. April MansonYalcinkaya for ongoing infertility evaluation and treatment as she chooses.    Dara Lordsimothy P Fontaine MD, 2:39 PM 09/12/2017

## 2017-09-14 ENCOUNTER — Telehealth: Payer: Self-pay | Admitting: *Deleted

## 2017-09-14 NOTE — Telephone Encounter (Signed)
-----   Message from Jerilynn Mageslaudia Shaffer sent at 09/13/2017  2:01 PM EST ----- Regarding: referral Dr Audie BoxFontaine told patient to follow up with Dr. April MansonYalcinkaya but when she called she was told that we need to send the referral.

## 2017-09-14 NOTE — Telephone Encounter (Signed)
Notes faxed to Dr.[name] office they will contact pt to schedule. 

## 2017-09-26 NOTE — Telephone Encounter (Signed)
Pt scheduled on 11/01/17 @ 2:30pm

## 2018-05-30 ENCOUNTER — Ambulatory Visit: Payer: BLUE CROSS/BLUE SHIELD | Admitting: Gynecology

## 2018-05-30 ENCOUNTER — Encounter: Payer: Self-pay | Admitting: Gynecology

## 2018-05-30 VITALS — BP 130/84 | Ht 60.0 in | Wt 260.0 lb

## 2018-05-30 DIAGNOSIS — Z01419 Encounter for gynecological examination (general) (routine) without abnormal findings: Secondary | ICD-10-CM | POA: Diagnosis not present

## 2018-05-30 NOTE — Patient Instructions (Signed)
Follow-up in 1 year for annual exam.  Continue to follow-up with Dr. April MansonYalcinkaya in reference to infertility.

## 2018-05-30 NOTE — Progress Notes (Signed)
    Kaitlyn [name] 03/29/1986 [id number]        32 y.o.  G1P1001 for annual gynecologic exam.  Also complaining of left ear pain on and off.  No fever chills sore throat or drainage noted.  Fell yesterday and hit her left side and was evaluated at the emergency room where she reports x-rays were negative.  She still having discomfort and asked me to look at it.  Past medical history,surgical history, problem list, medications, allergies, family history and social history were all reviewed and documented as reviewed in the EPIC chart.  ROS:  Performed with pertinent positives and negatives included in the history, assessment and plan.   Additional significant findings : None   Exam: Kennon PortelaKim Gardner assistant Vitals:   05/30/18 1102  BP: 130/84  Weight: 260 lb (117.9 kg)  Height: 5' (1.524 m)   Body mass index is 50.78 kg/m.  General appearance:  Normal affect, orientation and appearance. Skin: Grossly normal HEENT: Without gross lesions.  No cervical or supraclavicular adenopathy. Thyroid normal.  Bilateral otoscopic exam shows ear canals clear with tympanic membranes normal with good light reflection.  No evidence of fluid collection or infection. Lungs:  Clear without wheezing, rales or rhonchi.  Left chest wall/flank examined without evidence of bruising, hematoma or any abnormalities. Cardiac: RR, without RMG Abdominal:  Soft, nontender, without masses, guarding, rebound, organomegaly or hernia Breasts:  Examined lying and sitting without masses, retractions, discharge or axillary adenopathy. Pelvic:  Ext, BUS, Vagina: Normal  Cervix: Normal  Uterus: Difficult to palpate but no gross masses or tenderness adnexa: Without masses or tenderness    Anus and perineum: Normal   Rectovaginal: Normal sphincter tone without palpated masses or tenderness.    Assessment/Plan:  32 y.o. 781P1001 female for annual gynecologic exam with regular menses, no contraception.   1. Infertility.  Patient  is being seen by Dr. April MansonYalcinkaya.  She is undergoing weight loss now and will follow-up with him in reference to infertility.  We discussed various strategies to help with weight loss. 2. History of fall yesterday.  Exam negative without evidence of significant hematoma/bruising or trauma.  Recommended ibuprofen 800 mg every 8 hours and ice over the area for the next several days switching to heat as needed.  Follow-up if continues to be an issue. 3. Complaining of left ear pain.  Exam is normal.  Reassured patient.  We will follow-up with primary physician if continues to be an issue. 4. Breast health.  Breast exam normal today.  SBE monthly reviewed.  Plan screening mammography at age 32. 5. Pap smear 2018.  No Pap smear done today.  No history of abnormal Pap smears previously.  Plan repeat Pap smear at 3-year interval per current screening guidelines. 6. Health maintenance.  No routine lab work done as patient reports will be done elsewhere.  Follow-up 1 year, sooner as needed.   Dara Lordsimothy P Haruto Demaria MD, 12:21 PM 05/30/2018

## 2018-07-16 ENCOUNTER — Emergency Department (HOSPITAL_COMMUNITY)
Admission: EM | Admit: 2018-07-16 | Discharge: 2018-07-16 | Disposition: A | Discharge status: Home / Self Care | Payer: Self-pay | Attending: Emergency Medicine | Admitting: Emergency Medicine

## 2018-07-16 ENCOUNTER — Other Ambulatory Visit: Payer: Self-pay

## 2018-07-16 ENCOUNTER — Encounter (HOSPITAL_COMMUNITY): Payer: Self-pay

## 2018-07-16 DIAGNOSIS — Z87891 Personal history of nicotine dependence: Secondary | ICD-10-CM | POA: Insufficient documentation

## 2018-07-16 DIAGNOSIS — Z79899 Other long term (current) drug therapy: Secondary | ICD-10-CM | POA: Insufficient documentation

## 2018-07-16 DIAGNOSIS — J101 Influenza due to other identified influenza virus with other respiratory manifestations: Secondary | ICD-10-CM | POA: Insufficient documentation

## 2018-07-16 DIAGNOSIS — R6889 Other general symptoms and signs: Secondary | ICD-10-CM

## 2018-07-16 LAB — INFLUENZA PANEL BY PCR (TYPE A & B)
Influenza A By PCR: NEGATIVE
Influenza B By PCR: POSITIVE — AB

## 2018-07-16 MED ORDER — OSELTAMIVIR PHOSPHATE 75 MG PO CAPS: 75.0000 mg | ORAL_CAPSULE | Freq: Two times a day (BID) | ORAL | 0 refills | Status: DC

## 2018-07-16 NOTE — ED Notes (Signed)
Patient verbalizes understanding of discharge instructions. Opportunity for questioning and answers were provided. Armband removed by staff, pt discharged from ED.  

## 2018-07-16 NOTE — ED Provider Notes (Signed)
[facility] EMERGENCY [facility]PARTMENT Provider Note   CSN: [id number] Arrival date & time: 07/16/18  1343     History   Chief Complaint Chief Complaint  Patient presents with  . Flu Like Symptoms    HPI Kaitlyn [name] is a 32 y.o. female.  HPI   32 year old female with a medical history of morbid obesity presents today with complaints of viral illness.  Patient notes symptoms started yesterday with sore throat, body aches, headache, coughing, rhinorrhea and sneezing.  Patient notes that she has no associated shortness of breath or productive cough.  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  She notes she did not get the influenza vaccine this year.  Patient reports she is a non-smoker with no other significant past medical history.   Past Medical History:  Diagnosis Date  . Anemia   . Bipolar disorder (HCC)   . Chronic chest pain   . Endometriosis    ENDOMETRIOSIS???  . Pseudoseizures     Patient Active Problem List   Diagnosis Date Noted  . Midline thoracic back pain 02/06/2014  . Psychiatric pseudoseizure 02/06/2014  . Pseudoseizure 02/04/2014  . Obesity, morbid, BMI 50 or higher (HCC) 05/18/2011  . Infertility, female, secondary 05/18/2011  . Endometriosis 05/18/2011  . Bladder spasm 12/28/2010  . DYSURIA 12/18/2009  . CHEST PAIN 06/26/2009  . DYSPEPSIA 11/17/2008  . BIPOLAR II DISORDER 09/23/2008  . OBESITY, NOS 09/14/2006  . INSOMNIA NOS 09/14/2006    Past Surgical History:  Procedure Laterality Date  . CESAREAN SECTION    . PELVIC LAPAROSCOPY       OB History    Gravida  1   Para  1   Term  1   Preterm      AB      Living  1     SAB      TAB      Ectopic      Multiple      Live Births  1            Home Medications    Prior to Admission medications   Medication Sig Start Date End Date Taking? Authorizing Provider  ibuprofen (ADVIL,MOTRIN) 800 MG tablet Take 1  tablet (800 mg total) by mouth every 8 (eight) hours as needed. 08/31/17   Fontaine, Nadyne Coombesimothy P, MD  oseltamivir (TAMIFLU) 75 MG capsule Take 1 capsule (75 mg total) by mouth every 12 (twelve) hours. 07/16/18   Eyvonne MechanicHedges, Ryhanna Dunsmore, PA-C  Prenatal Vit-Fe Fumarate-FA (PRENATAL VITAMIN PO) Take by mouth.    [provider]    Family History Family History  Problem Relation Age of Onset  . Diabetes Mother   . Hypertension Mother   . Heart disease Mother     Social History Social History   Tobacco Use  . Smoking status: Former Smoker    Last attempt to quit: 01/06/2011    Years since quitting: 7.5  . Smokeless tobacco: Never Used  Substance Use Topics  . Alcohol use: No    Alcohol/week: 0.0 standard drinks  . Drug use: No     Allergies   Contrast media [iodinated diagnostic agents]; Ivp dye [iodinated diagnostic agents]; Iohexol; and Lamictal [lamotrigine]   Review of Systems Review of Systems  All other systems reviewed and are negative.   Physical Exam Updated Vital Signs BP (!) 137/97 (BP Location: Right Arm)   Pulse (!) 102   Temp  99.5 F (37.5 C)   Resp 18   Ht 5' (1.524 m)   Wt 117.9 kg   SpO2 100%   BMI 50.78 kg/m   Physical Exam Vitals signs and nursing note reviewed.  Constitutional:      Appearance: She is well-developed.  HENT:     Head: Normocephalic and atraumatic.     Comments: Minor erythema noted to the oropharynx, no edema, no exudate, no tonsillar swelling Eyes:     General: No scleral icterus.       Right eye: No discharge.        Left eye: No discharge.     Conjunctiva/sclera: Conjunctivae normal.     Pupils: Pupils are equal, round, and reactive to light.  Neck:     Musculoskeletal: Normal range of motion.     Vascular: No JVD.     Trachea: No tracheal deviation.  Pulmonary:     Effort: Pulmonary effort is normal. No respiratory distress.     Breath sounds: No stridor. No wheezing, rhonchi or rales.  Chest:     Chest wall: No  tenderness.  Neurological:     Mental Status: She is alert and oriented to person, place, and time.     Coordination: Coordination normal.  Psychiatric:        Behavior: Behavior normal.        Thought Content: Thought content normal.        Judgment: Judgment normal.      ED Treatments / Results  Labs (all labs ordered are listed, but only abnormal results are displayed) Labs Reviewed  INFLUENZA PANEL BY PCR (TYPE A & B)    EKG None  Radiology No results found.  Procedures Procedures (including critical care time)  Medications Ordered in ED Medications - No data to display   Initial Impression / Assessment and Plan / ED Course  I have reviewed the triage vital signs and the nursing notes.  Pertinent labs & imaging results that were available during my care of the patient were reviewed by me and considered in my medical decision making (see chart for details).     Labs: Influenza PCR  Imaging:  Consults:  Therapeutics:  Discharge Meds:   Assessment/Plan: 32 year old female presents today with likely viral illness.  Patient is concerned for influenza.  She does have signs and symptoms consistent with this.  Patient is morbidly obese and would be a candidate for Tamiflu if she is flu positive.  Patient will be given a prescription for Tamiflu while awaiting results.  Patient will follow along on my chart for results initiate therapy as needed for positive results.  She is very well-appearing with no signs of complicating features at this time.  She is given strict return precautions, she verbalized understanding and agreement to today's plan.  She has no signs of bacterial pneumonia.   Final Clinical Impressions(s) / ED Diagnoses   Final diagnoses:  Flu-like symptoms    ED Discharge Orders         Ordered    oseltamivir (TAMIFLU) 75 MG capsule  Every 12 hours     07/16/18 1452           Eyvonne MechanicHedges, Tejon Gracie, PA-C 07/16/18 1453    Virgina Norfolkuratolo, Adam,  DO 07/16/18 1901

## 2018-07-16 NOTE — ED Triage Notes (Signed)
Pt arrives POV for eval of flu like sx. Pt reports body aches, throat pain and headache. Coughing/sneezing. Endorses sick contacts, works at childcare facility. Reports onset 2 days ago.

## 2018-07-16 NOTE — Discharge Instructions (Addendum)
Please read attached information. If you experience any new or worsening signs or symptoms please return to the emergency room for evaluation. Please follow-up with your primary care provider or specialist as discussed. Please use medication prescribed only as directed and discontinue taking if you have any concerning signs or symptoms.   °

## 2018-11-29 ENCOUNTER — Emergency Department (HOSPITAL_COMMUNITY)
Admission: EM | Admit: 2018-11-29 | Discharge: 2018-11-29 | Disposition: A | Discharge status: Home / Self Care | Payer: BLUE CROSS/BLUE SHIELD | Attending: Emergency Medicine | Admitting: Emergency Medicine

## 2018-11-29 ENCOUNTER — Other Ambulatory Visit: Payer: Self-pay

## 2018-11-29 DIAGNOSIS — R51 Headache: Secondary | ICD-10-CM | POA: Diagnosis not present

## 2018-11-29 DIAGNOSIS — Z87891 Personal history of nicotine dependence: Secondary | ICD-10-CM | POA: Insufficient documentation

## 2018-11-29 DIAGNOSIS — Z79899 Other long term (current) drug therapy: Secondary | ICD-10-CM | POA: Diagnosis not present

## 2018-11-29 DIAGNOSIS — R519 Headache, unspecified: Secondary | ICD-10-CM

## 2018-11-29 MED ORDER — SODIUM CHLORIDE 0.9 % IV BOLUS
1000.0000 mL | Freq: Once | INTRAVENOUS | Status: AC
  Administered 2018-11-29: 06:00:00 1000 mL via INTRAVENOUS

## 2018-11-29 MED ORDER — METOCLOPRAMIDE HCL 5 MG/ML IJ SOLN
10.0000 mg | Freq: Once | INTRAMUSCULAR | Status: AC
  Administered 2018-11-29: 10 mg via INTRAVENOUS
  Filled 2018-11-29: qty 2

## 2018-11-29 MED ORDER — KETOROLAC TROMETHAMINE 30 MG/ML IJ SOLN
30.0000 mg | Freq: Once | INTRAMUSCULAR | Status: AC
  Administered 2018-11-29: 30 mg via INTRAVENOUS
  Filled 2018-11-29: qty 1

## 2018-11-29 MED ORDER — DIPHENHYDRAMINE HCL 50 MG/ML IJ SOLN
25.0000 mg | Freq: Once | INTRAMUSCULAR | Status: AC
  Administered 2018-11-29: 25 mg via INTRAVENOUS
  Filled 2018-11-29: qty 1

## 2018-11-29 NOTE — Discharge Instructions (Signed)
Can take tylenol or motrin if headache recurs.  If severe, you may want to try excedrin migraine. Follow-up with your primary care doctor. Return here for any new/acute changes.

## 2018-11-29 NOTE — ED Provider Notes (Signed)
[facility] EMERGENCY DEPARTMENT Provider Note   CSN: [id number] Arrival date & time: 11/29/18  3846    History   Chief Complaint Chief Complaint  Patient presents with  . Headache    HPI Jenaya Eastmond is a 33 y.o. female.     The history is provided by the patient and medical records.  Headache  Associated symptoms: nausea      33 y.o. F with hx of anemia, bipolar disorder, endometriosis, pseudoseizures, presenting to the ED for headache.  States this started yesterday, steadily worsening.  Pain worse across her forehead, throbbing in nature.  She has associated photophobia, nausea, and lightheadedness.  States history of headaches in the past and this feels similar.  She has not had any focal numbness, weakness, confusion, blurred vision, trouble walking, changes in speech, neck pain/stiffness, or fever.  She is not on anticoagulation.  She took motrin PTA without relief.  Past Medical History:  Diagnosis Date  . Anemia   . Bipolar disorder (HCC)   . Chronic chest pain   . Endometriosis    ENDOMETRIOSIS???  . Pseudoseizures     Patient Active Problem List   Diagnosis Date Noted  . Midline thoracic back pain 02/06/2014  . Psychiatric pseudoseizure 02/06/2014  . Pseudoseizure 02/04/2014  . Obesity, morbid, BMI 50 or higher (HCC) 05/18/2011  . Infertility, female, secondary 05/18/2011  . Endometriosis 05/18/2011  . Bladder spasm 12/28/2010  . DYSURIA 12/18/2009  . CHEST PAIN 06/26/2009  . DYSPEPSIA 11/17/2008  . BIPOLAR II DISORDER 09/23/2008  . OBESITY, NOS 09/14/2006  . INSOMNIA NOS 09/14/2006    Past Surgical History:  Procedure Laterality Date  . CESAREAN SECTION    . PELVIC LAPAROSCOPY       OB History    Gravida  1   Para  1   Term  1   Preterm      AB      Living  1     SAB      TAB      Ectopic      Multiple      Live Births  1            Home Medications    Prior to Admission medications    Medication Sig Start Date End Date Taking? Authorizing Provider  ibuprofen (ADVIL) 200 MG tablet Take 200 mg by mouth every 6 (six) hours as needed for headache.   Yes [provider]  labetalol (NORMODYNE) 100 MG tablet Take 100 mg by mouth 2 (two) times a day. 10/05/18  Yes [provider]  ibuprofen (ADVIL,MOTRIN) 800 MG tablet Take 1 tablet (800 mg total) by mouth every 8 (eight) hours as needed. Patient not taking: Reported on 11/29/2018 08/31/17   Fontaine, Nadyne Coombes, MD  oseltamivir (TAMIFLU) 75 MG capsule Take 1 capsule (75 mg total) by mouth every 12 (twelve) hours. Patient not taking: Reported on 11/29/2018 07/16/18   Eyvonne Mechanic, PA-C    Family History Family History  Problem Relation Age of Onset  . Diabetes Mother   . Hypertension Mother   . Heart disease Mother     Social History Social History   Tobacco Use  . Smoking status: Former Smoker    Last attempt to quit: 01/06/2011    Years since quitting: 7.9  . Smokeless tobacco: Never Used  Substance Use Topics  . Alcohol use: No    Alcohol/week: 0.0 standard drinks  . Drug use: No  Allergies   Contrast media [iodinated diagnostic agents]; Ivp dye [iodinated diagnostic agents]; Iohexol; and Lamictal [lamotrigine]   Review of Systems Review of Systems  Gastrointestinal: Positive for nausea.  Neurological: Positive for headaches.  All other systems reviewed and are negative.    Physical Exam Updated Vital Signs BP (!) 144/97   Pulse 67   Temp 97.9 F (36.6 C) (Oral)   Ht 4\' 9"  (1.448 m)   Wt 117.9 kg   SpO2 99%   BMI 56.26 kg/m   Physical Exam Vitals signs and nursing note reviewed.  Constitutional:      General: She is not in acute distress.    Appearance: She is well-developed. She is not diaphoretic.  HENT:     Head: Normocephalic and atraumatic.     Right Ear: External ear normal.     Left Ear: External ear normal.  Eyes:     Conjunctiva/sclera: Conjunctivae normal.      Pupils: Pupils are equal, round, and reactive to light.  Neck:     Musculoskeletal: Full passive range of motion without pain, normal range of motion and neck supple. No neck rigidity.     Comments: No rigidity, no meningismus Cardiovascular:     Rate and Rhythm: Normal rate and regular rhythm.     Heart sounds: Normal heart sounds. No murmur.  Pulmonary:     Effort: Pulmonary effort is normal. No respiratory distress.     Breath sounds: Normal breath sounds. No wheezing or rhonchi.  Abdominal:     General: Bowel sounds are normal.     Palpations: Abdomen is soft.     Tenderness: There is no abdominal tenderness. There is no guarding.  Musculoskeletal: Normal range of motion.  Skin:    General: Skin is warm and dry.     Findings: No rash.  Neurological:     Mental Status: She is alert and oriented to person, place, and time.     Cranial Nerves: No cranial nerve deficit.     Sensory: No sensory deficit.     Motor: No tremor or seizure activity.     Comments: AAOx3, answering questions and following commands appropriately; equal strength UE and LE bilaterally; CN grossly intact; moves all extremities appropriately without ataxia; no focal neuro deficits or facial asymmetry appreciated  Psychiatric:        Behavior: Behavior normal.        Thought Content: Thought content normal.      ED Treatments / Results  Labs (all labs ordered are listed, but only abnormal results are displayed) Labs Reviewed - No data to display  EKG None  Radiology No results found.  Procedures Procedures (including critical care time)  Medications Ordered in ED Medications  sodium chloride 0.9 % bolus 1,000 mL (1,000 mLs Intravenous New Bag/Given 11/29/18 0542)  ketorolac (TORADOL) 30 MG/ML injection 30 mg (30 mg Intravenous Given 11/29/18 0547)  metoCLOPramide (REGLAN) injection 10 mg (10 mg Intravenous Given 11/29/18 0543)  diphenhydrAMINE (BENADRYL) injection 25 mg (25 mg Intravenous Given  11/29/18 0542)     Initial Impression / Assessment and Plan / ED Course  I have reviewed the triage vital signs and the nursing notes.  Pertinent labs & imaging results that were available during my care of the patient were reviewed by me and considered in my medical decision making (see chart for details).  33 year old female here with bad headache that began yesterday.  States he feels consistent with prior bad headaches.  She reports  throbbing pain across her forehead along with photophobia, nausea, and lightheadedness.  She is afebrile and nontoxic.  Her neurologic exam is nonfocal.  She has no clinical signs or symptoms suggestive of meningitis.  Will treat with migraine cocktail and reassess.  6:40 AM Patient feeling better at this time.  No further nausea.  Vitals remain stable.  Appropriate for discharge.  Can continue symptomatic management at home, follow-up with PCP.  Return here for any new or acute changes.  Final Clinical Impressions(s) / ED Diagnoses   Final diagnoses:  Bad headache    ED Discharge Orders    None       Garlon Hatchet, PA-C 11/29/18 1610    Gilda Crease, MD 11/29/18 0700

## 2018-11-29 NOTE — ED Triage Notes (Signed)
Pt coming in with complaints of HA, nausea, and light sensitivity since yesterday. Pt has been taking Ibuprofen since yesterday, with last dose around 0445 this AM

## 2019-04-08 ENCOUNTER — Encounter: Payer: Self-pay | Admitting: Gynecology

## 2019-09-27 ENCOUNTER — Other Ambulatory Visit: Payer: Self-pay

## 2019-09-30 ENCOUNTER — Other Ambulatory Visit: Payer: Self-pay

## 2019-09-30 ENCOUNTER — Ambulatory Visit (INDEPENDENT_AMBULATORY_CARE_PROVIDER_SITE_OTHER): Payer: BLUE CROSS/BLUE SHIELD | Admitting: Obstetrics and Gynecology

## 2019-09-30 ENCOUNTER — Encounter: Payer: Self-pay | Admitting: Obstetrics and Gynecology

## 2019-09-30 ENCOUNTER — Telehealth: Payer: Self-pay | Admitting: *Deleted

## 2019-09-30 VITALS — BP 134/84 | Ht 60.0 in | Wt 274.0 lb

## 2019-09-30 DIAGNOSIS — R102 Pelvic and perineal pain: Secondary | ICD-10-CM | POA: Diagnosis not present

## 2019-09-30 DIAGNOSIS — N809 Endometriosis, unspecified: Secondary | ICD-10-CM

## 2019-09-30 DIAGNOSIS — R635 Abnormal weight gain: Secondary | ICD-10-CM | POA: Diagnosis not present

## 2019-09-30 DIAGNOSIS — Z01419 Encounter for gynecological examination (general) (routine) without abnormal findings: Secondary | ICD-10-CM | POA: Diagnosis not present

## 2019-09-30 MED ORDER — NORGESTIMATE-ETH ESTRADIOL 0.25-35 MG-MCG PO TABS: 1.0000 | ORAL_TABLET | Freq: Every day | ORAL | 0 refills | Status: DC

## 2019-09-30 NOTE — Telephone Encounter (Signed)
Patient called was seen today for annual exam reports her birth control pills was not received at the pharmacy. Per note 3 packs sent to pharmacy and patient will follow up after the 3 packs.

## 2019-09-30 NOTE — Patient Instructions (Addendum)
Please start the birth control pill and come back to see [place] in 3 months to see how you are doing, if not better, we will then order a pelvic ultrasound after the next visit.  Try taking 400-600 mg ibuprofen 4 times per day with food, starting the day before you expect your menstrual pain symptoms and headaches to begin before your period Ask your primary provider about checking thyroid level since you have been concerned with your weight

## 2019-09-30 NOTE — Progress Notes (Signed)
Kaitlyn [name] 04/23/1986 [id number]  SUBJECTIVE:  34 y.o. G1P1001 female here for annual routine gynecologic exam. She has been concerned about gaining weight. She has regular menstrual cycles.  She has been trying for 6 years to conceive.  She had no trouble with conceiving 14 years ago when she had her daughter.  She is with a different partner now.  She indicates she has a history of endometriosis and feels that she has had return of some of the pelvic pain symptoms just preceding her period, in addition to headaches and some increased nausea at that time of her cycle.  She indicates that she had an endometrioma removed surgically in the past.   Current Outpatient Medications  Medication Sig Dispense Refill  . cyclobenzaprine (FLEXERIL) 5 MG tablet Take 5 mg by mouth 3 (three) times daily as needed for muscle spasms.    Marland Kitchen ibuprofen (ADVIL) 200 MG tablet Take 200 mg by mouth every 6 (six) hours as needed for headache.    . labetalol (NORMODYNE) 100 MG tablet Take 100 mg by mouth 2 (two) times a day.    . Prenatal Vit-Fe Fumarate-FA (PRENATAL VITAMIN PO) Take by mouth.     No current facility-administered medications for this visit.   Allergies: Contrast media [iodinated diagnostic agents], Ivp dye [iodinated diagnostic agents], Iohexol, and Lamictal [lamotrigine]  Patient's last menstrual period was 09/18/2019.  Past medical history,surgical history, problem list, medications, allergies, family history and social history were all reviewed and documented as reviewed in the EPIC chart.  ROS:  Feeling well. No dyspnea or chest pain on exertion.  No abdominal pain, change in bowel habits, black or bloody stools.  No urinary tract symptoms. GYN ROS: normal menses, no abnormal bleeding, +right pelvic pain, no discharge, no breast pain or new or enlarging lumps on self exam. No neurological complaints.    OBJECTIVE:  BP 134/84 (Cuff Size: Large)   Ht 5' (1.524 m)   Wt 274 lb (124.3 kg)    LMP 09/18/2019   BMI 53.51 kg/m  The patient appears well, alert, oriented x 3, in no distress. ENT normal.  Neck supple. No cervical or supraclavicular adenopathy or thyromegaly.  Lungs are clear, good air entry, no wheezes, rhonchi or rales. S1 and S2 normal, no murmurs, regular rate and rhythm.  Abdomen soft without tenderness, guarding, mass or organomegaly.  Neurological is normal, no focal findings.  BREAST EXAM: breasts appear normal, no suspicious masses, no skin or nipple changes or axillary nodes  PELVIC EXAM: VULVA: normal appearing vulva with no masses, tenderness or lesions, VAGINA: normal appearing vagina with normal color and discharge, no lesions, CERVIX: normal appearing cervix without discharge or lesions, UTERUS: Exam limited by body habitus, uterus is normal size and nontender, ADNEXA: Exam limited by body habitus, normal adnexa in size, nontender and no masses  Chaperone: Kennon Portela present during the examination  ASSESSMENT:  34 y.o. G1P1001 here for annual gynecologic exam  PLAN:    1. Pap smear/HPV 04/2017 was normal.  Current screening guidelines calling for the 5-year Pap smear cytology only interval.  Pap smear would be due 04/2022. 2. Contraception.  None currently as she was helping to conceive.  We did prescribe Ortho-Cyclen to try to control of her endometriosis/pelvic pain symptoms and menstrual headaches as described below. 3.  Endometriosis.  We discussed with her history of endometriosis and increasing pelvic pain, I would recommend either trialing scheduled ibuprofen 400 to 600 mg every 6 hours starting the day  before the onset of her premenstrual symptoms, and/or hormonal contraception for a brief time to see if she can get control of her symptoms.  I understand that she was hoping to conceive soon, but the only other option in this setting would be to move towards surgical management, which is too aggressive at this point.  4.  Infertility.  We also  discussed correlation to endometriosis and infertility.  We discussed that intra-abdominal adhesions and fallopian tube adhesions can cause trouble with fertility.  If she would like to pursue a fertility work-up after she has completed 3 to 6 months of birth control therapy to address the endometriosis symptoms, we certainly could do so.  HSG and pelvic ultrasound would be indicated.  We could repeat lab work after she comes off this course of birth control in 3 to 6 months.  It sounds like she has worked with Dr. Kerin Perna in the past for fertility.  She has been taking a prenatal vitamin. 5. Normal breast exam.  Encouraged breast self awareness and notify us of any concerning changes. 6.  Elevated BMI with weight gain concerns.  She has a plan to increase her physical activity and is getting a bike.  We discussed healthy diet and calorie control.  Offered a dietitian consultation if she would be interested.  Consider checking a TSH when she has her routine labs with her primary care provider, she also is planning to discuss this further with her. With a BMI in 50s if diet and regular exercise are not producing results, may consider referral for bariatric surgery options. 7. Health maintenance.  Routine screening blood work is typically performed with her primary care provider.  Return clinic in 3 to 6 months.  Joseph Pierini MD  09/30/19

## 2019-10-29 ENCOUNTER — Telehealth: Payer: Self-pay | Admitting: *Deleted

## 2019-10-29 MED ORDER — NORETHINDRONE ACET-ETHINYL EST 1-20 MG-MCG PO TABS: 1.0000 | ORAL_TABLET | Freq: Every day | ORAL | 0 refills | Status: DC

## 2019-10-29 NOTE — Telephone Encounter (Signed)
We could try the Loestrin 1/20 equivalent continuously if she is okay not having a period, if she would prefer to have a monthly period, then she can just do the regular monthly administration of the Loestrin 1/20

## 2019-10-29 NOTE — Telephone Encounter (Signed)
Patient informed, Rx sent she would like to have a monthly cycle.

## 2019-10-29 NOTE — Telephone Encounter (Signed)
Patient called recently started on Ortho-cyclen 0.25 mg-35 mcg tablet on 1st pack, 2nd row and has noticed nausea, headaches, swollen ankles since starting medication. Patient is taking pill at 11:30am with food, reports swelling in ankles have decreased some, but no fully. Reports the headaches and nausea does subside,but returns in the evening hours. Patient would like to know your thoughts. Please advise

## 2019-12-15 ENCOUNTER — Other Ambulatory Visit: Payer: Self-pay | Admitting: Obstetrics and Gynecology

## 2019-12-23 ENCOUNTER — Other Ambulatory Visit: Payer: Self-pay | Admitting: Obstetrics and Gynecology

## 2019-12-30 ENCOUNTER — Other Ambulatory Visit: Payer: Self-pay

## 2019-12-31 ENCOUNTER — Ambulatory Visit (INDEPENDENT_AMBULATORY_CARE_PROVIDER_SITE_OTHER): Payer: BC Managed Care – PPO | Admitting: Obstetrics and Gynecology

## 2019-12-31 ENCOUNTER — Encounter: Payer: Self-pay | Admitting: Obstetrics and Gynecology

## 2019-12-31 VITALS — BP 124/82

## 2019-12-31 DIAGNOSIS — N809 Endometriosis, unspecified: Secondary | ICD-10-CM

## 2019-12-31 DIAGNOSIS — Z3141 Encounter for fertility testing: Secondary | ICD-10-CM

## 2019-12-31 DIAGNOSIS — R102 Pelvic and perineal pain: Secondary | ICD-10-CM | POA: Diagnosis not present

## 2019-12-31 MED ORDER — DOXYCYCLINE MONOHYDRATE 100 MG PO TABS: 100.0000 mg | ORAL_TABLET | Freq: Two times a day (BID) | ORAL | 0 refills | Status: AC

## 2019-12-31 NOTE — Patient Instructions (Addendum)
Please call [facility] when you get your next period so on day 3 of the.  We can have you come in for lab work, we can also try to schedule a pelvic ultrasound around that time as part of your infertility work-up We will also want to refer you for HSG (uterine dye test) to see if your fallopian tubes are open, and also arrange to have your partner go in for a semen analysis test

## 2019-12-31 NOTE — Addendum Note (Signed)
Addended by: Theresia Majors D on: 12/31/2019 11:14 AM   Modules accepted: Level of Service

## 2019-12-31 NOTE — Progress Notes (Signed)
   Kaitlyn [name] 1985-10-19 [id number]  SUBJECTIVE:  34 y.o. G47P1001 female with a reported history of endometriosis who presents for 75-month follow-up of combined hormonal contraceptive pills therapy for control of pelvic pain symptoms and menstrual migraines.  She also has been dealing with infertility with a new partner.  Her headaches have greatly improved, she has been feeling very well on the continuous Loestrin, other than having some spotting that started a week before her expected period.  She is now on the placebo week.  She does desire to conceive.  Current Outpatient Medications  Medication Sig Dispense Refill  . ibuprofen (ADVIL) 200 MG tablet Take 200 mg by mouth every 6 (six) hours as needed for headache.    . labetalol (NORMODYNE) 100 MG tablet Take 100 mg by mouth 2 (two) times a day.    . norethindrone-ethinyl estradiol (LOESTRIN) 1-20 MG-MCG tablet TAKE 1 TABLET BY MOUTH EVERY DAY 84 tablet 3  . Prenatal Vit-Fe Fumarate-FA (PRENATAL VITAMIN PO) Take by mouth.    . cyclobenzaprine (FLEXERIL) 5 MG tablet Take 5 mg by mouth 3 (three) times daily as needed for muscle spasms. (Patient not taking: Reported on 12/31/2019)     No current facility-administered medications for this visit.   Allergies: Contrast media [iodinated diagnostic agents], Ivp dye [iodinated diagnostic agents], Iohexol, and Lamictal [lamotrigine]  Patient's last menstrual period was 12/17/2019.  Past medical history,surgical history, problem list, medications, allergies, family history and social history were all reviewed and documented as reviewed in the EPIC chart.   OBJECTIVE:  BP 124/82   LMP 12/17/2019  The patient appears well, alert, oriented x 3, in no distress. PELVIC EXAM: Deferred (performed at last visit)   ASSESSMENT:  34 y.o. G1P1001 here for follow-up of oral contraceptive use for menstrual migraines and endometriosis/pelvic pain, also with infertility  PLAN:  Pelvic pain symptoms and  menstrual migraines have responded well to the use of continuous OCPs, she tolerated the Loestrin equivalent much better than the Sprintec equivalent. As she wants to conceive, I recommended that she discontinue the birth control pills now since she is on her placebo week.  She is aware her pelvic pain and headache symptoms may return. On day 3 of her menstrual cycle I would like her to get lab work so she will call when she starts her period.  Labs to be collected include AMH, FSH, LH, TSH, prolactin, estradiol level, testosterone level. We will also arrange for her to have a pelvic ultrasound. At this cycle or the next she will also need an HSG on cycle day 5 or so, and her husband will need a referral for semen analysis.  I did send her a prescription for doxycycline prophylaxis to take when she does the HSG. We discussed that at any point she can choose to seek the assistance of an REI subspecialist, or if there are any abnormalities on her work-up that would make that more beneficial we will move forward with that recommendation.  She is aware that in this office we are not infertility specialists, but we can initiate the infertility work-up and basic treatments with oral ovulation induction agents if appropriate.   Theresia Majors MD 12/31/19

## 2020-01-09 ENCOUNTER — Other Ambulatory Visit: Payer: Self-pay

## 2020-01-10 ENCOUNTER — Telehealth: Payer: Self-pay | Admitting: *Deleted

## 2020-01-10 ENCOUNTER — Telehealth: Payer: Self-pay | Admitting: Nurse Practitioner

## 2020-01-10 ENCOUNTER — Other Ambulatory Visit: Payer: BC Managed Care – PPO

## 2020-01-10 DIAGNOSIS — Z3141 Encounter for fertility testing: Secondary | ICD-10-CM

## 2020-01-10 DIAGNOSIS — R102 Pelvic and perineal pain: Secondary | ICD-10-CM

## 2020-01-10 DIAGNOSIS — N979 Female infertility, unspecified: Secondary | ICD-10-CM

## 2020-01-10 DIAGNOSIS — N809 Endometriosis, unspecified: Secondary | ICD-10-CM

## 2020-01-10 MED ORDER — DOXYCYCLINE HYCLATE 100 MG PO CAPS: 100.0000 mg | ORAL_CAPSULE | Freq: Two times a day (BID) | ORAL | 0 refills | Status: DC

## 2020-01-10 MED ORDER — IBUPROFEN 800 MG PO TABS: 800.0000 mg | ORAL_TABLET | Freq: Three times a day (TID) | ORAL | 2 refills | Status: DC | PRN

## 2020-01-10 NOTE — Telephone Encounter (Signed)
Rx sent 

## 2020-01-10 NOTE — Telephone Encounter (Signed)
Yes, please send a prescription for 30 tablets of ibuprofen 800 mg tablets, every 8 hours as needed taken with food for cramping.  Two refills.  thank you

## 2020-01-10 NOTE — Telephone Encounter (Signed)
Phone call to patient to review instructions for 13 hr prep for CT w/ contrast on 01/16/20 at 1100. Prescription called into [facility]. Pt aware and verbalized understanding of instructions. Prescription: 2200- 50mg  Prednisone 0400- 50mg  Prednisone 1000- 50mg  Prednisone and 50mg  Benadryl

## 2020-01-10 NOTE — Telephone Encounter (Signed)
Patient called asking if you would be wiling to prescribed ibuprofen 800 mg to help with menstrual cramps? Patient has used in past and it helped. Please advise

## 2020-01-10 NOTE — Telephone Encounter (Signed)
Patient walked in the office to have labs done and questioned about HSG. Her LMP:01/09/20, order placed at [facility] for HSG, Rx sent for doxy. Since patient was here in the office getting labs I called [facility] at [phone] to schedule,but had to leave a message, I asked her to call me back to schedule or she may call the patient directly. I called to relay this information to patient, but her voicemail was full.     Per staff message from Dr.Kendall:  Hi, this patient will be calling in when she starts her next period to get infertility lab work, I also ordered a pelvic ultrasound so she can have this done around the time she comes in for lab work or whenever else at her convenience. On day 5 of her cycle she should get a referral for HSG (take doxycycline 100 mg BID x 5 days starting with HSG) and I would like her husband also get a semen analysis done wherever we typically send patients for that. Thank you

## 2020-01-13 NOTE — Telephone Encounter (Signed)
Patient informed with all the below, HSG scheduled on 01/16/20 at [facility], aware when to take antibiotic.

## 2020-01-14 ENCOUNTER — Other Ambulatory Visit: Payer: Self-pay

## 2020-01-14 DIAGNOSIS — R7989 Other specified abnormal findings of blood chemistry: Secondary | ICD-10-CM

## 2020-01-15 LAB — TESTOS,TOTAL,FREE AND SHBG (FEMALE)
Free Testosterone: 2.2 pg/mL (ref 0.1–6.4)
Sex Hormone Binding: 104 nmol/L (ref 17–124)
Testosterone, Total, LC-MS-MS: 28 ng/dL (ref 2–45)

## 2020-01-15 LAB — FOLLICLE STIMULATING HORMONE: FSH: 5.9 m[IU]/mL

## 2020-01-15 LAB — LUTEINIZING HORMONE: LH: 1.4 m[IU]/mL

## 2020-01-15 LAB — ESTRADIOL: Estradiol: 17 pg/mL

## 2020-01-15 LAB — PROLACTIN: Prolactin: 34.4 ng/mL — ABNORMAL HIGH

## 2020-01-15 LAB — TSH: TSH: 1.5 mIU/L

## 2020-01-16 ENCOUNTER — Encounter: Payer: Self-pay | Admitting: Obstetrics and Gynecology

## 2020-01-16 ENCOUNTER — Ambulatory Visit
Admission: RE | Admit: 2020-01-16 | Discharge: 2020-01-16 | Disposition: A | Discharge status: Home / Self Care | Payer: BC Managed Care – PPO | Source: Ambulatory Visit | Attending: Obstetrics and Gynecology | Admitting: Obstetrics and Gynecology

## 2020-01-16 DIAGNOSIS — N971 Female infertility of tubal origin: Secondary | ICD-10-CM | POA: Insufficient documentation

## 2020-01-16 DIAGNOSIS — N979 Female infertility, unspecified: Secondary | ICD-10-CM

## 2020-01-23 ENCOUNTER — Encounter: Payer: Self-pay | Admitting: Obstetrics and Gynecology

## 2020-01-23 ENCOUNTER — Other Ambulatory Visit: Payer: BC Managed Care – PPO

## 2020-01-23 ENCOUNTER — Telehealth: Payer: Self-pay | Admitting: *Deleted

## 2020-01-23 ENCOUNTER — Ambulatory Visit (INDEPENDENT_AMBULATORY_CARE_PROVIDER_SITE_OTHER): Payer: BC Managed Care – PPO

## 2020-01-23 ENCOUNTER — Other Ambulatory Visit: Payer: Self-pay

## 2020-01-23 ENCOUNTER — Ambulatory Visit (INDEPENDENT_AMBULATORY_CARE_PROVIDER_SITE_OTHER): Payer: BC Managed Care – PPO | Admitting: Obstetrics and Gynecology

## 2020-01-23 VITALS — BP 134/80

## 2020-01-23 DIAGNOSIS — N801 Endometriosis of ovary: Secondary | ICD-10-CM

## 2020-01-23 DIAGNOSIS — N979 Female infertility, unspecified: Secondary | ICD-10-CM

## 2020-01-23 DIAGNOSIS — N8301 Follicular cyst of right ovary: Secondary | ICD-10-CM

## 2020-01-23 DIAGNOSIS — N80129 Deep endometriosis of ovary, unspecified ovary: Secondary | ICD-10-CM

## 2020-01-23 DIAGNOSIS — Z3141 Encounter for fertility testing: Secondary | ICD-10-CM

## 2020-01-23 DIAGNOSIS — N809 Endometriosis, unspecified: Secondary | ICD-10-CM | POA: Diagnosis not present

## 2020-01-23 DIAGNOSIS — R102 Pelvic and perineal pain: Secondary | ICD-10-CM

## 2020-01-23 MED ORDER — CLOMIPHENE CITRATE 50 MG PO TABS: 50.0000 mg | ORAL_TABLET | Freq: Every day | ORAL | 0 refills | Status: AC

## 2020-01-23 NOTE — Progress Notes (Signed)
Kaitlyn [name] [birth year] [id number]  SUBJECTIVE:  34 y.o. G41P1001 female with a history of endometriosis who presents with her significant other for pelvic ultrasound for her fertility evaluation. In the interim since her last visit, she had a HSG on 01/16/2020 which indicated a patent left fallopian tube, but apparent occlusion of the right fallopian tube. Sounds like she did previously have infertility evaluation and treatment at [facility] 2017-2018 (notes are in care everywhere). Their documentation indicates she had a chromopertubation during laparoscopy 02/2016 which indicated bilateral patency. She did have a robotic left ovarian cystectomy for a 7 cm endometrioma at that time. They did undergo IUI and Femara OI with trigger injections but this was not successful. Shee continues to have a regular menstrual cycle. Her significant other did just submit a sample for semen analysis yesterday and that result is pending.    Current Outpatient Medications  Medication Sig Dispense Refill  . cyclobenzaprine (FLEXERIL) 5 MG tablet Take 5 mg by mouth 3 (three) times daily as needed for muscle spasms.     Marland Kitchen ibuprofen (ADVIL) 200 MG tablet Take 200 mg by mouth every 6 (six) hours as needed for headache.    . ibuprofen (ADVIL) 800 MG tablet Take 1 tablet (800 mg total) by mouth every 8 (eight) hours as needed for cramping. )Take with food) 30 tablet 2  . labetalol (NORMODYNE) 100 MG tablet Take 100 mg by mouth 2 (two) times a day.    . Prenatal Vit-Fe Fumarate-FA (PRENATAL VITAMIN PO) Take by mouth.    . clomiPHENE (CLOMID) 50 MG tablet Take 1 tablet (50 mg total) by mouth daily for 5 days. Start on day 5 of menstrual cycle 5 tablet 0   No current facility-administered medications for this visit.   Allergies: Ivp dye [iodinated diagnostic agents], Iohexol, and Lamictal [lamotrigine]  Patient's last menstrual period was 01/09/2020.  Past medical history,surgical history, problem list,  medications, allergies, family history and social history were all reviewed and documented as reviewed in the EPIC chart.  ROS:  Feeling well. No dyspnea or chest pain on exertion.  No abdominal pain, change in bowel habits, black or bloody stools.  No urinary tract symptoms. GYN ROS: normal menses, no abnormal bleeding, pelvic pain or discharge   OBJECTIVE:  BP 134/80 (Cuff Size: Large)   LMP 01/09/2020  The patient appears well, alert, oriented x 3, in no distress. PELVIC EXAM: Deferred  Pelvic ultrasound Uterus 8.9 x 3.4 x 4.8 cm, anteverted. No fibroids. Normal endometrium measuring 5.0 cm thickness. Right ovary with 2 small simple follicles, both measuring 1.5 cm. One has a slightly thickened wall. Left ovary with 2 endometriomas cysts measuring 2.3 x 1.7 cm and 1.4 x 0.7 cm. No other adnexal masses. No free fluid.   ASSESSMENT:  34 y.o. G1P1001 here for continued infertility work-up and counseling  PLAN:  We reviewed her normal lab work-up with the exception of a slightly elevated prolactin level. We discussed the potential significance of this, but often is an isolated finding that normalizes with repeat evaluation. She will make an appointment to return for a repeat prolactin check in the upcoming weeks. We reviewed the endometriomas and that this would indicate her endometriosis is active which increases pelvic inflammation and therefore may decrease her fertility. We also discussed that we cannot confirm patency of her right fallopian tube based on the HSG results, and she does have risk factors for pelvic inflammation with the endometriosis so it is possible her  right tube is no longer patent. Realizing these potentially fertility impacting factors, we discussed proceeding with basic OI agents Clomid versus Femara versus surgical removal of the tiny endometriomas on her left ovary. She indicates that she would like to go ahead with Clomid to use something different than she was  previously. We also discussed that we do not yet know of the semen analysis results.  If this indicates any abnormalities then she should strongly consider referral back to REI for discussion of IUI versus IVF/ICSI. Understanding that we are only using basic OI treatment with Clomid and that she has factors that may be limiting the effectiveness of this agent, she wants to go ahead and try proceeding with treatment here locally. I provided the patient with my handout on Clomid that reviews use of the medication proper timing of obtaining lab work and suggested timing of intercourse, and medication side effects.   Common side effects could include night sweats, hot flashes, vaginal dryness, headache, dizziness, nausea, stomach upset, bloating, and mood changes.   She understands that cycle day 1 is the first day of menstrual bleeding.    Clomid is to be taken on cycle day 5 through 9.   I recommend timing of intercourse to every other day on cycle days 12, 14, 16.  I reviewed the risk of using Clomid with obtaining multiple gestation, which are onsidered to be high risk with higher risk for complications including preterm delivery and/or maternal medical complications.   We can perform up to 6 cycles of Clomid therapy with escalating doses as indicated.  If the patient is unable to achieve conception and there are no other findings on the remainder of the patient's infertility workup, at that point I would recommend referral to REI.    Theresia Majors MD 01/23/20

## 2020-01-23 NOTE — Patient Instructions (Signed)
Clomid Use  Potential side effects include hot flashes, night sweating, vaginal dryness, mood changes, headache, bloating, nausea, upset stomach.    Please be on a prenatal vitamin before starting Clomid.  Clomid is to be taken once daily on cycle days 5 through 9 of the menstrual cycle (cycle day 1 is the first day of the period).  If you do not normally get a period, then it is okay to start Clomid at any point as long as your pregnancy test is negative.  Time intercourse for mid cycle on cycle days 12, 14 and 16, and any other time outside that timeframe that as desired.    If the next period skips/is late, check a pregnancy test to ensure that it is negative before moving onto the next cycle of Clomid.   Call [facility] office at or send a MyChart Message to request another refill of Clomid if needed.

## 2020-01-23 NOTE — Telephone Encounter (Signed)
PA done via cover my meds for clomid 50 mg tablet, will wait for response from insurance.

## 2020-01-24 NOTE — Telephone Encounter (Signed)
Medication approved until 01/22/2021,pharmacy informed.

## 2020-02-13 ENCOUNTER — Other Ambulatory Visit: Payer: BC Managed Care – PPO

## 2020-02-13 ENCOUNTER — Other Ambulatory Visit: Payer: Self-pay

## 2020-02-13 DIAGNOSIS — R7989 Other specified abnormal findings of blood chemistry: Secondary | ICD-10-CM

## 2020-02-13 LAB — PROLACTIN: Prolactin: 16.1 ng/mL

## 2020-03-02 ENCOUNTER — Telehealth: Payer: Self-pay | Admitting: *Deleted

## 2020-03-02 NOTE — Telephone Encounter (Signed)
Patient completed 1st round of clomid 50 mg tablet and it was unsuccessful. She asked what the next step would be? Please advise

## 2020-03-02 NOTE — Telephone Encounter (Signed)
If she got her period, then refill clomid 50 mg daily x 5 days (day 5-9 of menstrual cycle) and repeat in same way as the first cycle. If she did not get a period, then confirm negative pregnancy test (at home or here in office) and then we can give Provera 10 mg x 10 days to induce a period, then do the clomid.

## 2020-03-03 MED ORDER — CLOMIPHENE CITRATE 50 MG PO TABS: 50.0000 mg | ORAL_TABLET | Freq: Every day | ORAL | 0 refills | Status: DC

## 2020-03-03 NOTE — Telephone Encounter (Signed)
Sent mychart message

## 2020-03-30 NOTE — Telephone Encounter (Signed)
Please refill go up on clomid to 100 mg daily x 5 days (day 5-9 of menstrual cycle) and repeat in same way as the first cycle.  Just wondering if her husband ever received his semen analysis results? I believe I saw them a while back and just wanted to make sure he was aware.

## 2020-03-31 MED ORDER — CLOMIPHENE CITRATE 50 MG PO TABS: ORAL_TABLET | ORAL | 0 refills | Status: DC

## 2020-03-31 NOTE — Addendum Note (Signed)
Addended by: Aura Camps on: 03/31/2020 08:31 AM   Modules accepted: Orders

## 2020-04-27 NOTE — Telephone Encounter (Signed)
If she is getting a monthly cycle then Clomid is likely helping.  Another measure of ovulation is a day 21 progesterone level which she could do this month if she would like.  Doing the antral follicle count ultrasound is another option at fertility clinics. She should take another course of 100 mg Clomid daily, days 5 through 9 of her menstrual cycle. Since we are not a fertility clinic, we unfortunately do not have access to other methods that may be needed (such as IUI) to help her and her husband with fertility.

## 2020-04-28 MED ORDER — CLOMIPHENE CITRATE 50 MG PO TABS: ORAL_TABLET | ORAL | 0 refills | Status: DC

## 2020-04-28 NOTE — Addendum Note (Signed)
Addended by: Aura Camps on: 04/28/2020 03:59 PM   Modules accepted: Orders

## 2020-05-25 MED ORDER — CLOMIPHENE CITRATE 50 MG PO TABS: ORAL_TABLET | ORAL | 0 refills | Status: DC

## 2020-05-25 NOTE — Addendum Note (Signed)
Addended by: Aura Camps on: 05/25/2020 02:15 PM   Modules accepted: Orders

## 2020-05-25 NOTE — Telephone Encounter (Signed)
Looks like she has done 100 mg Clomid the last 2 cycles.  Let's have her go to 150 mg Clomid this cycle days 5 through 9 of her menstrual cycle.  It is possible with higher doses that she might experience more ovulatory pain in the midcycle, if any of this pain is significant not going away then I would want her to come in for an ultrasound to check for an ovarian cyst.  Do we have her husband's semen analysis results on file?

## 2020-05-25 NOTE — Telephone Encounter (Signed)
150 mg daily day 5 through 9, thank you

## 2020-05-25 NOTE — Telephone Encounter (Signed)
Dr.Kendall I want to confirm will this be 150 mg daily day 5-9 or 150 mg twice daily day 5-9?

## 2020-06-22 NOTE — Telephone Encounter (Signed)
I am sorry to hear the Clomid is not working. I do know that if they had to try more in depth treatment last time with IUI and 'trigger shots' that Clomid alone is not going to be very like to work, unfortunately.  The only thing I could additionally add would be to consider a laparoscopy to evaluate for recurrence of endometriosis (at some point). But if she would be considering doing more intensive fertility treatments with an REI specialist at some time, then that may be best to discuss with them.

## 2020-06-22 NOTE — Telephone Encounter (Signed)
Dr.Kendall see patient message.

## 2020-12-08 ENCOUNTER — Other Ambulatory Visit: Payer: Self-pay

## 2020-12-08 ENCOUNTER — Ambulatory Visit (HOSPITAL_COMMUNITY)
Admission: EM | Admit: 2020-12-08 | Discharge: 2020-12-08 | Discharge status: Against Medical Advice | Payer: BC Managed Care – PPO

## 2021-10-17 DIAGNOSIS — G4733 Obstructive sleep apnea (adult) (pediatric): Secondary | ICD-10-CM | POA: Diagnosis not present

## 2021-10-17 DIAGNOSIS — I10 Essential (primary) hypertension: Secondary | ICD-10-CM | POA: Diagnosis not present

## 2021-10-18 DIAGNOSIS — R69 Illness, unspecified: Secondary | ICD-10-CM | POA: Diagnosis not present

## 2021-10-18 DIAGNOSIS — F4312 Post-traumatic stress disorder, chronic: Secondary | ICD-10-CM | POA: Diagnosis not present

## 2021-10-28 DIAGNOSIS — F4312 Post-traumatic stress disorder, chronic: Secondary | ICD-10-CM | POA: Diagnosis not present

## 2021-10-28 DIAGNOSIS — R69 Illness, unspecified: Secondary | ICD-10-CM | POA: Diagnosis not present

## 2021-11-01 DIAGNOSIS — F4312 Post-traumatic stress disorder, chronic: Secondary | ICD-10-CM | POA: Diagnosis not present

## 2021-11-01 DIAGNOSIS — R69 Illness, unspecified: Secondary | ICD-10-CM | POA: Diagnosis not present

## 2021-11-11 DIAGNOSIS — F4312 Post-traumatic stress disorder, chronic: Secondary | ICD-10-CM | POA: Diagnosis not present

## 2021-11-11 DIAGNOSIS — R69 Illness, unspecified: Secondary | ICD-10-CM | POA: Diagnosis not present

## 2021-11-16 DIAGNOSIS — G4733 Obstructive sleep apnea (adult) (pediatric): Secondary | ICD-10-CM | POA: Diagnosis not present

## 2021-11-16 DIAGNOSIS — I10 Essential (primary) hypertension: Secondary | ICD-10-CM | POA: Diagnosis not present

## 2021-11-18 DIAGNOSIS — F4312 Post-traumatic stress disorder, chronic: Secondary | ICD-10-CM | POA: Diagnosis not present

## 2021-11-18 DIAGNOSIS — R69 Illness, unspecified: Secondary | ICD-10-CM | POA: Diagnosis not present

## 2021-12-01 DIAGNOSIS — R69 Illness, unspecified: Secondary | ICD-10-CM | POA: Diagnosis not present

## 2021-12-01 DIAGNOSIS — F4312 Post-traumatic stress disorder, chronic: Secondary | ICD-10-CM | POA: Diagnosis not present

## 2021-12-17 DIAGNOSIS — G4733 Obstructive sleep apnea (adult) (pediatric): Secondary | ICD-10-CM | POA: Diagnosis not present

## 2021-12-17 DIAGNOSIS — R69 Illness, unspecified: Secondary | ICD-10-CM | POA: Diagnosis not present

## 2021-12-17 DIAGNOSIS — I10 Essential (primary) hypertension: Secondary | ICD-10-CM | POA: Diagnosis not present

## 2021-12-17 DIAGNOSIS — F4312 Post-traumatic stress disorder, chronic: Secondary | ICD-10-CM | POA: Diagnosis not present

## 2022-01-16 DIAGNOSIS — G4733 Obstructive sleep apnea (adult) (pediatric): Secondary | ICD-10-CM | POA: Diagnosis not present

## 2022-01-16 DIAGNOSIS — I10 Essential (primary) hypertension: Secondary | ICD-10-CM | POA: Diagnosis not present

## 2022-03-06 ENCOUNTER — Telehealth: Payer: BC Managed Care – PPO | Admitting: Family

## 2022-03-06 DIAGNOSIS — J069 Acute upper respiratory infection, unspecified: Secondary | ICD-10-CM

## 2022-03-06 MED ORDER — CETIRIZINE HCL 10 MG PO TABS: 10.0000 mg | ORAL_TABLET | Freq: Every day | ORAL | 0 refills | Status: DC

## 2022-03-06 MED ORDER — FLUTICASONE PROPIONATE 50 MCG/ACT NA SUSP: 2.0000 | Freq: Every day | NASAL | 6 refills | Status: DC

## 2022-03-06 MED ORDER — CETIRIZINE HCL 10 MG PO TABS: 10.0000 mg | ORAL_TABLET | Freq: Every day | ORAL | 0 refills | Status: AC

## 2022-03-06 MED ORDER — FLUTICASONE PROPIONATE 50 MCG/ACT NA SUSP: 2.0000 | Freq: Every day | NASAL | 6 refills | Status: AC

## 2022-03-06 NOTE — Patient Instructions (Signed)
Viral Respiratory Infection A respiratory infection is an illness that affects part of the respiratory system, such as the lungs, nose, or throat. A respiratory infection that is caused by a virus is called a viral respiratory infection. Common types of viral respiratory infections include: A cold. The flu (influenza). A respiratory syncytial virus (RSV) infection. What are the causes? This condition is caused by a virus. The virus may spread through contact with droplets or direct contact with infected people or their mucus or secretions. The virus may spread from person to person (is contagious). What are the signs or symptoms? Symptoms of this condition include: A stuffy or runny nose. A sore throat or cough. Shortness of breath or difficulty breathing. Yellow or green mucus (sputum). Other symptoms may include: A fever. Sweating or chills. Fatigue. Achy muscles. A headache. How is this diagnosed? This condition may be diagnosed based on: Your symptoms. A physical exam. Testing of secretions from the nose or throat. Chest X-ray. How is this treated? This condition may be treated with medicines, such as: Antiviral medicine. This may shorten the length of time a person has symptoms. Expectorants. These make it easier to cough up mucus. Decongestant nasal sprays. Acetaminophen or NSAIDs, such as ibuprofen, to relieve fever and pain. Antibiotic medicines are not prescribed for viral infections.This is because antibiotics are designed to kill bacteria. They do not kill viruses. Follow these instructions at home: Managing pain and congestion Take over-the-counter and prescription medicines only as told by your health care provider. If you have a sore throat, gargle with a mixture of salt and water 3-4 times a day or as needed. To make salt water, completely dissolve -1 tsp (3-6 g) of salt in 1 cup (237 mL) of warm water. Use nose drops made from salt water to ease congestion and  soften raw skin around your nose. Take 2 tsp (10 mL) of honey at bedtime to lessen coughing at night. Do not give honey to children who are younger than 1 year. Drink enough fluid to keep your urine pale yellow. This helps prevent dehydration and helps loosen up mucus. General instructions  Rest as much as possible. Do not drink alcohol. Do not use any products that contain nicotine or tobacco. These products include cigarettes, chewing tobacco, and vaping devices, such as e-cigarettes. If you need help quitting, ask your health care provider. Keep all follow-up visits. This is important. How is this prevented?     Get an annual flu shot. You may get the flu shot in late summer, fall, or winter. Ask your health care provider when you should get your flu shot. Avoid spreading your infection to other people. If you are sick: Wash your hands with soap and water often, especially after you cough or sneeze. Wash for at least 20 seconds. If soap and water are not available, use alcohol-based hand sanitizer. Cover your mouth when you cough. Cover your nose and mouth when you sneeze. Do not share cups or eating utensils. Clean commonly used objects often. Clean commonly touched surfaces. Stay home from work or school as told by your health care provider. Avoid contact with people who are sick during cold and flu season. This is generally fall and winter. Contact a health care provider if: Your symptoms last for 10 days or longer. Your symptoms get worse over time. You have severe sinus pain in your face or forehead. The glands in your jaw or neck become very swollen. You have shortness of breath. Get   help right away if you: Feel pain or pressure in your chest. Have trouble breathing. Faint or feel like you will faint. Have severe and persistent vomiting. Feel confused or disoriented. These symptoms may represent a serious problem that is an emergency. Do not wait to see if the symptoms will  go away. Get medical help right away. Call your local emergency services (911 in the [place] Do not drive yourself to the hospital. Summary A respiratory infection is an illness that affects part of the respiratory system, such as the lungs, nose, or throat. A respiratory infection that is caused by a virus is called a viral respiratory infection. Common types of viral respiratory infections include a cold, influenza, and respiratory syncytial virus (RSV) infection. Symptoms of this condition include a stuffy or runny nose, cough, fatigue, achy muscles, sore throat, and fevers or chills. Antibiotic medicines are not prescribed for viral infections. This is because antibiotics are designed to kill bacteria. They are not effective against viruses. This information is not intended to replace advice given to you by your health care provider. Make sure you discuss any questions you have with your health care provider. Document Revised: 10/08/2020 Document Reviewed: 10/08/2020 Elsevier Patient Education  2023 [employer]  

## 2022-03-06 NOTE — Progress Notes (Signed)
Virtual Visit Consent   Kaitlyn [name], you are scheduled for a virtual visit with a [facility] provider today. Just as with appointments in the office, your consent must be obtained to participate. Your consent will be active for this visit and any virtual visit you may have with one of our providers in the next 365 days. If you have a MyChart account, a copy of this consent can be sent to you electronically.  As this is a virtual visit, video technology does not allow for your provider to perform a traditional examination. This may limit your provider's ability to fully assess your condition. If your provider identifies any concerns that need to be evaluated in person or the need to arrange testing (such as labs, EKG, etc.), we will make arrangements to do so. Although advances in technology are sophisticated, we cannot ensure that it will always work on either your end or our end. If the connection with a video visit is poor, the visit may have to be switched to a telephone visit. With either a video or telephone visit, we are not always able to ensure that we have a secure connection.  By engaging in this virtual visit, you consent to the provision of healthcare and authorize for your insurance to be billed (if applicable) for the services provided during this visit. Depending on your insurance coverage, you may receive a charge related to this service.  I need to obtain your verbal consent now. Are you willing to proceed with your visit today? Kaitlyn [name] has provided verbal consent on 03/06/2022 for a virtual visit (video or telephone). Kaitlyn Rodney, FNP  Date: 03/06/2022 8:57 AM  Virtual Visit via Video Note   I, Kaitlyn [name], connected with  Kaitlyn [name]  ([id number], July 22, 1985) on 03/06/22 at  8:45 AM EDT by a video-enabled telemedicine application and verified that I am speaking with the correct person using two identifiers.  Location: Patient: Virtual Visit Location Patient:  Home Provider: Virtual Visit Location Provider: Home Office   I discussed the limitations of evaluation and management by telemedicine and the availability of in person appointments. The patient expressed understanding and agreed to proceed.    History of Present Illness: Kaitlyn [name] is a 36 y.o. who identifies as a female who was assigned female at birth, and is being seen today for sore throat that started Thursday. Then a few days later noticed ear congestion and dry cough. She did a home COVID test that was negative.   HPI: Sore Throat  This is a new problem. The current episode started in the past 7 days. The problem has been waxing and waning. There has been no fever. The pain is at a severity of 2/10. The pain is mild. Associated symptoms include congestion, coughing and ear pain. Pertinent negatives include no ear discharge, headaches, shortness of breath, swollen glands or trouble swallowing. She has tried acetaminophen (Mucinex) for the symptoms. The treatment provided mild relief.    Problems:  Patient Active Problem List   Diagnosis Date Noted   Obstruction of right fallopian tube 01/16/2020   Midline thoracic back pain 02/06/2014   Psychiatric pseudoseizure 02/06/2014   Pseudoseizure 02/04/2014   Obesity, morbid, BMI 50 or higher (HCC) 05/18/2011   Infertility, female, secondary 05/18/2011   Endometriosis 05/18/2011   Bladder spasm 12/28/2010   DYSURIA 12/18/2009   CHEST PAIN 06/26/2009   DYSPEPSIA 11/17/2008   BIPOLAR II DISORDER 09/23/2008   OBESITY, NOS 09/14/2006   INSOMNIA NOS 09/14/2006  Allergies:  Allergies  Allergen Reactions   Ivp Dye [Iodinated Contrast Media] Hives and Itching   Iohexol      Code: HIVES, Desc: nausea/vomiting-itching/hives/rash after mri contrast multihance-benedryl was given by radiologist-then pt had seizure-pt was monitored and released to family's care, Onset Date: [id number]    Lamictal [Lamotrigine] Rash   Medications:  Current  Outpatient Medications:    cetirizine (ZYRTEC ALLERGY) 10 MG tablet, Take 1 tablet (10 mg total) by mouth daily., Disp: 90 tablet, Rfl: 0   fluticasone (FLONASE) 50 MCG/ACT nasal spray, Place 2 sprays into both nostrils daily., Disp: 16 g, Rfl: 6   ibuprofen (ADVIL) 200 MG tablet, Take 200 mg by mouth every 6 (six) hours as needed for headache., Disp: , Rfl:    Prenatal Vit-Fe Fumarate-FA (PRENATAL VITAMIN PO), Take by mouth., Disp: , Rfl:   Observations/Objective: Patient is well-developed, well-nourished in no acute distress.  Resting comfortably  at home.  Head is normocephalic, atraumatic.  No labored breathing.  Speech is clear and coherent with logical content.  Patient is alert and oriented at baseline.  Nasal congestion  Assessment and Plan: 1. Viral URI - fluticasone (FLONASE) 50 MCG/ACT nasal spray; Place 2 sprays into both nostrils daily.  Dispense: 16 g; Refill: 6 - cetirizine (ZYRTEC ALLERGY) 10 MG tablet; Take 1 tablet (10 mg total) by mouth daily.  Dispense: 90 tablet; Refill: 0  - Take meds as prescribed - Use a cool mist humidifier  -Use saline nose sprays frequently -Force fluids -For any cough or congestion  Use plain Mucinex- regular strength or max strength is fine -For fever or aces or pains- take tylenol or ibuprofen. -Throat lozenges if help -Follow up if symptoms worsen or do not improve   Follow Up Instructions: I discussed the assessment and treatment plan with the patient. The patient was provided an opportunity to ask questions and all were answered. The patient agreed with the plan and demonstrated an understanding of the instructions.  A copy of instructions were sent to the patient via MyChart unless otherwise noted below.     The patient was advised to call back or seek an in-person evaluation if the symptoms worsen or if the condition fails to improve as anticipated.  Time:  I spent 7 minutes with the patient via telehealth technology discussing  the above problems/concerns.    Kaitlyn Rodney, FNP

## 2023-03-24 ENCOUNTER — Other Ambulatory Visit: Payer: Self-pay

## 2023-03-24 ENCOUNTER — Encounter: Payer: Self-pay | Admitting: Podiatry

## 2023-03-24 ENCOUNTER — Ambulatory Visit (INDEPENDENT_AMBULATORY_CARE_PROVIDER_SITE_OTHER): Payer: 59 | Admitting: Podiatry

## 2023-03-24 ENCOUNTER — Ambulatory Visit (INDEPENDENT_AMBULATORY_CARE_PROVIDER_SITE_OTHER): Payer: Self-pay

## 2023-03-24 DIAGNOSIS — M778 Other enthesopathies, not elsewhere classified: Secondary | ICD-10-CM | POA: Diagnosis not present

## 2023-03-24 DIAGNOSIS — M79672 Pain in left foot: Secondary | ICD-10-CM

## 2023-03-24 MED ORDER — MELOXICAM 15 MG PO TABS: 15.0000 mg | ORAL_TABLET | Freq: Every day | ORAL | 0 refills | Status: AC

## 2023-03-24 NOTE — Progress Notes (Signed)
  Subjective:  Patient ID: Kaitlyn [name], female    DOB: 03/26/1986,   MRN: [id number]  Chief Complaint  Patient presents with   Foot Pain    Pt present for pain in her left foot that started about two weeks ago. Pt has been using heating pads to relieve the pain but it did not seem to help a lot.    37 y.o. female presents for concern of left foot pain that has been ongoing for a couple months. Denies any injuries. Relates painful when walking on the foot and sometimes gets burning and tingling. Relates trying a heating pad and tylenol which is not helping. Hoping for some relief. . Denies any other pedal complaints. Denies n/v/f/c.   Past Medical History:  Diagnosis Date   Anemia    Bipolar disorder (HCC)    Chronic chest pain    Endometriosis    ENDOMETRIOSIS???   Hypertension    Pseudoseizures     Objective:  Physical Exam: Vascular: DP/PT pulses 2/4 bilateral. CFT <3 seconds. Normal hair growth on digits. No edema.  Skin. No lacerations or abrasions bilateral feet.  Musculoskeletal: MMT 5/5 bilateral lower extremities in DF, PF, Inversion and Eversion. Deceased ROM in DF of ankle joint. Tender to the fifth MPJ area and pain to lateral eminence of the fifth digit and plantar fifth MPJ. Mild pain with Rom of the MPJ. No pain noted proximally or to other digits.  Neurological: Sensation intact to light touch.   Assessment:   1. Capsulitis of left foot      Plan:  Patient was evaluated and treated and all questions answered. Discussed capsulitis and inflammation of joint and treatment options with patient.  Radiographs reviewed and discussed with patient. No acute fractures or dislocations.  Injection offered today. Patient deferred Discussed stiff sole shoes and recommend use of metatarsal pad.   Meloxicam provided and sent to pharmacy.  Discussed if pain does not improve may consider PT and/or MRI for further surgical planning.  Patient to return in 6 weeks or sooner if  concerns arise.    Louann Sjogren, DPM

## 2023-05-05 ENCOUNTER — Ambulatory Visit: Payer: 59 | Admitting: Podiatry

## 2023-05-12 ENCOUNTER — Ambulatory Visit: Payer: 59 | Admitting: Podiatry

## 2024-03-06 LAB — AMB RESULTS CONSOLE CBG: Glucose: 101

## 2024-05-01 NOTE — Progress Notes (Signed)
 The patient attended a screening event on 03/06/2024 where her BP screening results was 127/85, non-fasting blood glucose 101. At the event the patient noted she has [employer] and does not smoke. Patient did not have any SDOH insecurities. Pt listed pcp as Dr. Camie Franchot Mirza, PA-C. Per chart review pt has a pcp and the last office visit was 09/11/2023 for upper respiratory tract infection via telehealth. According to chart pt goal is to maintain Blood Pressure<14090 and will eat a mediterranean diet with high fiber, low refined carbs and low saturated fat. Post event initial f/u [facility] called pt pcp office on 05/01/2024 to confirm that pt is established with them but does not have future appts scheduled.  No additional [facility] team support indicated at this time.
